# Patient Record
Sex: Male | Born: 1955 | Race: White | Hispanic: No | Marital: Married | Smoking: Former smoker
Health system: Southern US, Community
[De-identification: ages and names within clinical notes are randomized; demographics above are authoritative.]

## PROBLEM LIST (undated history)

## (undated) DIAGNOSIS — H3530 Unspecified macular degeneration: Secondary | ICD-10-CM

## (undated) DIAGNOSIS — E785 Hyperlipidemia, unspecified: Secondary | ICD-10-CM

## (undated) DIAGNOSIS — I10 Essential (primary) hypertension: Secondary | ICD-10-CM

## (undated) DIAGNOSIS — M1990 Unspecified osteoarthritis, unspecified site: Secondary | ICD-10-CM

## (undated) DIAGNOSIS — H9191 Unspecified hearing loss, right ear: Secondary | ICD-10-CM

## (undated) DIAGNOSIS — Z8489 Family history of other specified conditions: Secondary | ICD-10-CM

## (undated) DIAGNOSIS — E119 Type 2 diabetes mellitus without complications: Secondary | ICD-10-CM

## (undated) DIAGNOSIS — G4730 Sleep apnea, unspecified: Secondary | ICD-10-CM

## (undated) DIAGNOSIS — Z87442 Personal history of urinary calculi: Secondary | ICD-10-CM

## (undated) DIAGNOSIS — N200 Calculus of kidney: Secondary | ICD-10-CM

## (undated) DIAGNOSIS — K922 Gastrointestinal hemorrhage, unspecified: Secondary | ICD-10-CM

## (undated) HISTORY — PX: KIDNEY STONE SURGERY: SHX686

## (undated) HISTORY — PX: ACHILLES TENDON REPAIR: SUR1153

## (undated) HISTORY — PX: COLONOSCOPY: SHX174

---

## 2004-08-20 ENCOUNTER — Emergency Department: Payer: Self-pay | Admitting: General Practice

## 2012-04-26 ENCOUNTER — Ambulatory Visit: Payer: Self-pay | Admitting: Emergency Medicine

## 2012-04-26 LAB — RAPID STREP-A WITH REFLX: Micro Text Report: NEGATIVE

## 2012-04-26 LAB — RAPID INFLUENZA A&B ANTIGENS

## 2012-04-28 LAB — BETA STREP CULTURE(ARMC)

## 2012-11-11 DIAGNOSIS — T884XXA Failed or difficult intubation, initial encounter: Secondary | ICD-10-CM | POA: Insufficient documentation

## 2014-01-02 DIAGNOSIS — K922 Gastrointestinal hemorrhage, unspecified: Secondary | ICD-10-CM | POA: Insufficient documentation

## 2014-04-10 DIAGNOSIS — D500 Iron deficiency anemia secondary to blood loss (chronic): Secondary | ICD-10-CM | POA: Insufficient documentation

## 2014-07-17 DIAGNOSIS — E7800 Pure hypercholesterolemia, unspecified: Secondary | ICD-10-CM | POA: Insufficient documentation

## 2014-07-17 DIAGNOSIS — I10 Essential (primary) hypertension: Secondary | ICD-10-CM | POA: Insufficient documentation

## 2014-09-13 ENCOUNTER — Encounter: Payer: Self-pay | Admitting: Emergency Medicine

## 2014-09-13 ENCOUNTER — Ambulatory Visit
Admission: EM | Admit: 2014-09-13 | Discharge: 2014-09-13 | Disposition: A | Discharge status: Home / Self Care | Payer: Managed Care, Other (non HMO) | Attending: Registered Nurse | Admitting: Registered Nurse

## 2014-09-13 DIAGNOSIS — T148 Other injury of unspecified body region: Secondary | ICD-10-CM

## 2014-09-13 DIAGNOSIS — T148XXA Other injury of unspecified body region, initial encounter: Secondary | ICD-10-CM

## 2014-09-13 DIAGNOSIS — L03116 Cellulitis of left lower limb: Secondary | ICD-10-CM

## 2014-09-13 HISTORY — DX: Calculus of kidney: N20.0

## 2014-09-13 HISTORY — DX: Essential (primary) hypertension: I10

## 2014-09-13 MED ORDER — CLINDAMYCIN HCL 300 MG PO CAPS: 300.0000 mg | ORAL_CAPSULE | Freq: Four times a day (QID) | ORAL | Status: AC

## 2014-09-13 MED ORDER — ACETAMINOPHEN 500 MG PO TABS: 1000.0000 mg | ORAL_TABLET | Freq: Four times a day (QID) | ORAL | Status: DC | PRN

## 2014-09-13 NOTE — ED Provider Notes (Signed)
CSN: [id number]     Arrival date & time 09/13/14  1137 History   None    Chief Complaint  Patient presents with  . 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  Patient slightly limping due to pain.  The history is provided by the patient.    Past Medical History  Diagnosis Date  . Kidney stones   . Hypertension    History reviewed. No pertinent past surgical history. History reviewed. No pertinent family history. History  Substance Use Topics  . Smoking status: Never Smoker   . Smokeless tobacco: Never Used  . Alcohol Use: No    Review of Systems  Constitutional: Negative for fever, chills, diaphoresis, activity change, appetite change and fatigue.  HENT: Negative for facial swelling and nosebleeds.   Eyes: Negative for pain and discharge.  Cardiovascular: Positive for leg swelling. Negative for chest pain and palpitations.  Gastrointestinal: Negative for nausea, vomiting and diarrhea.  Endocrine: Negative for cold intolerance and heat intolerance.  Genitourinary: Negative for dysuria.  Musculoskeletal: Positive for myalgias and gait problem. Negative for back pain, joint swelling, arthralgias, neck pain and neck stiffness.  Skin: Positive for color change and rash. Negative for pallor and wound.  Allergic/Immunologic: Negative for food allergies.  Neurological: Negative for dizziness, tremors, syncope, weakness and headaches.  Hematological: Negative for adenopathy. Does not bruise/bleed easily.  Psychiatric/Behavioral: Negative for behavioral problems, confusion, sleep disturbance and agitation.    Allergies  Penicillins and Sulfa antibiotics  Home Medications   Prior to  Admission medications   Medication Sig Start Date End Date Taking? Authorizing Provider  lisinopril (PRINIVIL,ZESTRIL) 5 MG tablet Take 5 mg by mouth daily.   Yes Historical Provider, MD  tamsulosin (FLOMAX) 0.4 MG CAPS capsule Take 0.4 mg by mouth.   Yes Historical Provider, MD  acetaminophen (TYLENOL) 500 MG tablet Take 2 tablets (1,000 mg total) by mouth every 6 (six) hours as needed for moderate pain. 09/13/14   Barbaraann Barthel, NP  clindamycin (CLEOCIN) 300 MG capsule Take 1 capsule (300 mg total) by mouth 4 (four) times daily. 09/13/14 09/19/14  Jarold Song Betancourt, NP   BP 123/75 mmHg  Pulse 78  Temp(Src) 98.5 F (36.9 C) (Oral)  Resp 16  Ht  (1.93 m)  Wt 290 lb (131.543 kg)  BMI 35.31 kg/m2  SpO2 97% Physical Exam  Constitutional: He is oriented to person, place, and time. Vital signs are normal. He appears well-developed and well-nourished. No distress.  HENT:  Head: Normocephalic and atraumatic.  Right Ear: External ear normal.  Left Ear: External ear normal.  Nose: Nose normal.  Mouth/Throat: Oropharynx is clear and moist. No oropharyngeal exudate.  Eyes: Conjunctivae, EOM and lids are normal. Pupils are equal, round, and reactive to light. Right eye exhibits no discharge. Left eye exhibits no discharge. No scleral icterus.  Neck: Trachea normal and normal range of motion. Neck supple. No tracheal deviation present.  Cardiovascular: Normal rate, regular rhythm and intact distal pulses.   Pulmonary/Chest: Effort normal and breath sounds normal. No respiratory distress. He has no wheezes.  Abdominal: Soft. He exhibits no distension.  Musculoskeletal: Normal range of motion. He exhibits edema and tenderness.       Right lower leg: He exhibits tenderness, swelling and deformity. He exhibits no bony  tenderness, no edema and no laceration.  Limping with ambulation in exam room and hallway  Neurological: He is alert and oriented to person, place, and time.  Skin: Skin is warm, dry  and intact. Rash noted. No abrasion, no bruising, no burn, no ecchymosis, no laceration, no lesion, no petechiae and no purpura noted. Rash is macular. Rash is not papular, not maculopapular, not nodular, not pustular, not vesicular and not urticarial. He is not diaphoretic. There is erythema. No pallor.     Psychiatric: He has a normal mood and affect. His speech is normal and behavior is normal. Judgment and thought content normal. Cognition and memory are normal.  Nursing note and vitals reviewed.   ED Course  Procedures (including critical care time) Labs Review Labs Reviewed - No data to display  Imaging Review No results found.   MDM   1. Cellulitis of left lower extremity   2. Contusion    Exitcare handout on skin infection given to patient.  Allergic to penicillin and sulfa will start clindamycin 300mg  po QID F7D.  RTC if worsening erythema, pain, purulent discharge, fever or no improvement after 48 hours on antibiotics.  Discussed with patient sometimes 14 day course clindamycin required and to call day 6 if redness not completely resolved but improving to extend Rx duration.  Patient verbalized understanding, agreed with plan of care and had no further questions at this time.   Discussed with patient to ice, rest, elevate left leg.  Bone contusion may take weeks to completely resolve/heal.  Follow up for re-evaluation if no improvement or worsening for imaging.  Patient didn't want xray at this time.  Patient verbalized understanding of information/instructions, agreed with plan of care and had no further questions at this time.     Barbaraann Barthelina A Betancourt, NP 09/13/14 1328

## 2014-09-13 NOTE — Discharge Instructions (Signed)
Contusion °A contusion is a deep bruise. Contusions are the result of an injury that caused bleeding under the skin. The contusion may turn blue, purple, or yellow. Minor injuries will give you a painless contusion, but more severe contusions may stay painful and swollen for a few weeks.  °CAUSES  °A contusion is usually caused by a blow, trauma, or direct force to an area of the body. °SYMPTOMS  °· Swelling and redness of the injured area. °· Bruising of the injured area. °· Tenderness and soreness of the injured area. °· Pain. °DIAGNOSIS  °The diagnosis can be made by taking a history and physical exam. An X-ray, CT scan, or MRI may be needed to determine if there were any associated injuries, such as fractures. °TREATMENT  °Specific treatment will depend on what area of the body was injured. In general, the best treatment for a contusion is resting, icing, elevating, and applying cold compresses to the injured area. Over-the-counter medicines may also be recommended for pain control. Ask your caregiver what the best treatment is for your contusion. °HOME CARE INSTRUCTIONS  °· Put ice on the injured area. °¨ Put ice in a plastic bag. °¨ Place a towel between your skin and the bag. °¨ Leave the ice on for 15-20 minutes, 3-4 times a day, or as directed by your health care provider. °· Only take over-the-counter or prescription medicines for pain, discomfort, or fever as directed by your caregiver. Your caregiver may recommend avoiding anti-inflammatory medicines (aspirin, ibuprofen, and naproxen) for 48 hours because these medicines may increase bruising. °· Rest the injured area. °· If possible, elevate the injured area to reduce swelling. °SEEK IMMEDIATE MEDICAL CARE IF:  °· You have increased bruising or swelling. °· You have pain that is getting worse. °· Your swelling or pain is not relieved with medicines. °MAKE SURE YOU:  °· Understand these instructions. °· Will watch your condition. °· Will get help right  away if you are not doing well or get worse. °Document Released: 01/08/2005 Document Revised: 04/05/2013 Document Reviewed: 02/03/2011 °ExitCare® Patient Information ©2015 [employer]. This information is not intended to replace advice given to you by your health care provider. Make sure you discuss any questions you have with your health care provider. °Cellulitis °Cellulitis is an infection of the skin and the tissue beneath it. The infected area is usually red and tender. Cellulitis occurs most often in the arms and lower legs.  °CAUSES  °Cellulitis is caused by bacteria that enter the skin through cracks or cuts in the skin. The most common types of bacteria that cause cellulitis are staphylococci and streptococci. °SIGNS AND SYMPTOMS  °· Redness and warmth. °· Swelling. °· Tenderness or pain. °· Fever. °DIAGNOSIS  °Your health care provider can usually determine what is wrong based on a physical exam. Blood tests may also be done. °TREATMENT  °Treatment usually involves taking an antibiotic medicine. °HOME CARE INSTRUCTIONS  °· Take your antibiotic medicine as directed by your health care provider. Finish the antibiotic even if you start to feel better. °· Keep the infected arm or leg elevated to reduce swelling. °· Apply a warm cloth to the affected area up to 4 times per day to relieve pain. °· Take medicines only as directed by your health care provider. °· Keep all follow-up visits as directed by your health care provider. °SEEK MEDICAL CARE IF:  °· You notice red streaks coming from the infected area. °· Your red area gets larger or turns   dark in color. °· Your bone or joint underneath the infected area becomes painful after the skin has healed. °· Your infection returns in the same area or another area. °· You notice a swollen bump in the infected area. °· You develop new symptoms. °· You have a fever. °SEEK IMMEDIATE MEDICAL CARE IF:  °· You feel very sleepy. °· You develop vomiting or diarrhea. °· You  have a general ill feeling (malaise) with muscle aches and pains. °MAKE SURE YOU:  °· Understand these instructions. °· Will watch your condition. °· Will get help right away if you are not doing well or get worse. °Document Released: 01/08/2005 Document Revised: 08/15/2013 Document Reviewed: 06/16/2011 °ExitCare® Patient Information ©2015 [employer]. This information is not intended to replace advice given to you by your health care provider. Make sure you discuss any questions you have with your health care provider. ° °

## 2014-09-13 NOTE — ED Notes (Signed)
Patient states he hit his shin 1 week ago.  Leg is swollen and painful all the way down to ankle

## 2016-02-26 DIAGNOSIS — E1165 Type 2 diabetes mellitus with hyperglycemia: Secondary | ICD-10-CM | POA: Insufficient documentation

## 2016-02-26 DIAGNOSIS — E119 Type 2 diabetes mellitus without complications: Secondary | ICD-10-CM | POA: Insufficient documentation

## 2016-08-28 ENCOUNTER — Encounter: Payer: Self-pay | Admitting: Emergency Medicine

## 2016-08-28 ENCOUNTER — Emergency Department
Admission: EM | Admit: 2016-08-28 | Discharge: 2016-08-28 | Disposition: A | Discharge status: Home / Self Care | Payer: Managed Care, Other (non HMO) | Attending: Emergency Medicine | Admitting: Emergency Medicine

## 2016-08-28 ENCOUNTER — Emergency Department: Payer: Managed Care, Other (non HMO)

## 2016-08-28 DIAGNOSIS — E119 Type 2 diabetes mellitus without complications: Secondary | ICD-10-CM | POA: Diagnosis not present

## 2016-08-28 DIAGNOSIS — Z7984 Long term (current) use of oral hypoglycemic drugs: Secondary | ICD-10-CM | POA: Diagnosis not present

## 2016-08-28 DIAGNOSIS — R42 Dizziness and giddiness: Secondary | ICD-10-CM | POA: Diagnosis present

## 2016-08-28 DIAGNOSIS — I10 Essential (primary) hypertension: Secondary | ICD-10-CM | POA: Diagnosis not present

## 2016-08-28 DIAGNOSIS — Z79899 Other long term (current) drug therapy: Secondary | ICD-10-CM | POA: Insufficient documentation

## 2016-08-28 HISTORY — DX: Unspecified hearing loss, right ear: H91.91

## 2016-08-28 HISTORY — DX: Type 2 diabetes mellitus without complications: E11.9

## 2016-08-28 LAB — BASIC METABOLIC PANEL
Anion gap: 10 (ref 5–15)
BUN: 18 mg/dL (ref 6–20)
CHLORIDE: 108 mmol/L (ref 101–111)
CO2: 19 mmol/L — AB (ref 22–32)
Calcium: 9.3 mg/dL (ref 8.9–10.3)
Creatinine, Ser: 0.98 mg/dL (ref 0.61–1.24)
GFR calc non Af Amer: >60 mL/min (ref >60)
Glucose, Bld: 203 mg/dL — ABNORMAL HIGH (ref 65–99)
POTASSIUM: 4.8 mmol/L (ref 3.5–5.1)
SODIUM: 137 mmol/L (ref 135–145)

## 2016-08-28 LAB — CBC
HEMATOCRIT: 42.4 % (ref 40.0–52.0)
Hemoglobin: 14.5 g/dL (ref 13.0–18.0)
MCH: 31.3 pg (ref 26.0–34.0)
MCHC: 34.3 g/dL (ref 32.0–36.0)
MCV: 91.2 fL (ref 80.0–100.0)
Platelets: 229 10*3/uL (ref 150–440)
RBC: 4.65 MIL/uL (ref 4.40–5.90)
RDW: 12.6 % (ref 11.5–14.5)
WBC: 7.2 10*3/uL (ref 3.8–10.6)

## 2016-08-28 LAB — TROPONIN I: Troponin I: <0.03 ng/mL (ref <0.03)

## 2016-08-28 MED ORDER — ONDANSETRON HCL 4 MG/2ML IJ SOLN
4.0000 mg | Freq: Once | INTRAMUSCULAR | Status: AC
  Administered 2016-08-28: 4 mg via INTRAVENOUS
  Filled 2016-08-28: qty 2

## 2016-08-28 MED ORDER — DIAZEPAM 5 MG PO TABS: 5.0000 mg | ORAL_TABLET | Freq: Three times a day (TID) | ORAL | 0 refills | Status: DC | PRN

## 2016-08-28 MED ORDER — MECLIZINE HCL 25 MG PO TABS: 25.0000 mg | ORAL_TABLET | Freq: Three times a day (TID) | ORAL | 1 refills | Status: DC | PRN

## 2016-08-28 MED ORDER — SODIUM CHLORIDE 0.9 % IV SOLN
Freq: Once | INTRAVENOUS | Status: AC
  Administered 2016-08-28: 11:00:00 via INTRAVENOUS

## 2016-08-28 MED ORDER — DIAZEPAM 5 MG PO TABS
5.0000 mg | ORAL_TABLET | Freq: Once | ORAL | Status: AC
  Administered 2016-08-28: 5 mg via ORAL
  Filled 2016-08-28: qty 1

## 2016-08-28 MED ORDER — MECLIZINE HCL 25 MG PO TABS
50.0000 mg | ORAL_TABLET | Freq: Once | ORAL | Status: AC
  Administered 2016-08-28: 50 mg via ORAL
  Filled 2016-08-28: qty 2

## 2016-08-28 NOTE — ED Provider Notes (Signed)
[facility] Emergency Department Provider Note       Time seen: ----------------------------------------- 11:21 AM on 08/28/2016 -----------------------------------------     I have reviewed the triage vital signs and the nursing notes.   HISTORY   Chief Complaint Dizziness    HPI Mitchell Case is a 61 y.o. male who presents to the ED for sudden onset dizziness began this morning at 5 AM. Patient reports vomiting twice due to dizziness. Patient states dizziness is worse with movement and with his eyes open. He denies any history of vertigo. He did have sudden onset left hearing loss in January. ENT was not sure why he lost hearing. He took 3 weeks of steroids which somewhat improved his hearing loss. He was started on glipizide 2 days ago for his diabetes. He denies weakness or confusion.    Past Medical History:  Diagnosis Date  . Diabetes mellitus without complication (HCC)   . 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 Hypertension   . Kidney stones     There are no active problems to display for this patient.   Past Surgical History:  Procedure Laterality Date  . KIDNEY STONE SURGERY Bilateral     Allergies Penicillins and Sulfa antibiotics  Social History Social History  Substance Use Topics  . Smoking status: Never Smoker  . Smokeless tobacco: Never Used  . Alcohol use No    Review of Systems Constitutional: Negative for fever. Eyes: Negative for vision changes ENT:  Negative for congestion, sore throat Cardiovascular: Negative for chest pain. Respiratory: Negative for shortness of breath. Gastrointestinal: Negative for abdominal pain, vomiting and diarrhea. Genitourinary: Negative for dysuria. Musculoskeletal: Negative for back pain. Skin: Negative for rash. Neurological: Negative for headaches, focal weakness or numbness.  All systems negative/normal/unremarkable except as stated in the  HPI  ____________________________________________   PHYSICAL EXAM:  VITAL SIGNS: ED Triage Vitals  Enc Vitals Group     BP 08/28/16 0957 119/77     Pulse Rate 08/28/16 0957 62     Resp 08/28/16 0957 18     Temp 08/28/16 0957 97.7 F (36.5 C)     Temp Source 08/28/16 0950 Oral     SpO2 08/28/16 0957 98 %     Weight 08/28/16 0950 285 lb (129.3 kg)     Height 08/28/16 0950 6\' 4"  (1.93 m)     Head Circumference --      Peak Flow --      Pain Score 08/28/16 0949 0     Pain Loc --      Pain Edu? --      Excl. in GC? --     Constitutional: Alert and oriented. Well appearing and in no distress. Eyes: Conjunctivae are normal. PERRL. Normal extraocular movements. ENT   Head: Normocephalic and atraumatic.   Nose: No congestion/rhinnorhea.   Mouth/Throat: Mucous membranes are moist.   Neck: No stridor. Cardiovascular: Normal rate, regular rhythm. No murmurs, rubs, or gallops. Respiratory: Normal respiratory effort without tachypnea nor retractions. Breath sounds are clear and equal bilaterally. No wheezes/rales/rhonchi. Gastrointestinal: Soft and nontender. Normal bowel sounds Musculoskeletal: Nontender with normal range of motion in extremities. No lower extremity tenderness nor edema. Neurologic:  Normal speech and language. No gross focal neurologic deficits are appreciated. Strength, sensation, cranial nerves are intact Skin:  Skin is warm, dry and intact. No rash noted. Psychiatric: Mood and affect are normal. Speech and behavior are normal.  ____________________________________________  EKG: Interpreted by me.  Sinus rhythm with a rate of 65 bpm, normal PR interval, normal QRS, normal QT.  ____________________________________________  ED COURSE:  Pertinent labs & imaging results that were available during my care of the patient were reviewed by me and considered in my medical decision making (see chart for details). Patient presents for vertigo symptoms, we will  assess with labs and imaging as indicated.   Procedures ____________________________________________   LABS (pertinent positives/negatives)  Labs Reviewed  BASIC METABOLIC PANEL - Abnormal; Notable for the following:       Result Value   CO2 19 (*)    Glucose, Bld 203 (*)    All other components within normal limits  CBC  URINALYSIS, COMPLETE (UACMP) WITH MICROSCOPIC  TROPONIN I    RADIOLOGY Images were viewed by me  CT head is unremarkable  ____________________________________________  FINAL ASSESSMENT AND PLAN  Vertigo  Plan: Patient's labs and imaging were dictated above. Patient had presented for symptoms of vertigo that began around 5 AM. Currently symptoms are improved with meclizine and Valium. Clinically this does not appear to be central vertigo and his symptoms have resolved at this time. He'll be referred to ENT for outpatient follow-up. He does take a baby aspirin, we will encourage continue taking his home medications but holding his glipizide until vertigo has resolved.    Emily FilbertWilliams, Klohe Lovering E, MD   Note: This note was generated in part or whole with voice recognition software. Voice recognition is usually quite accurate but there are transcription errors that can and very often do occur. I apologize for any typographical errors that were not detected and corrected.     Emily FilbertWilliams, Irma Delancey E, MD 08/28/16 [phone]

## 2016-08-28 NOTE — ED Notes (Signed)
NAD noted at time of D/C. Pt denies questions or concerns. Pt ambulatory to the lobby at this time. Pt refused wheelchair to the lobby.  

## 2016-08-28 NOTE — ED Triage Notes (Signed)
Patient presents to the ED with sudden dizziness that began this am at 5.  Patient reports vomiting x 2 due to dizziness this am.  Patient states dizziness is worse with movement and with eyes open.  Patient denies history of vertigo.  Patient states he had sudden right ear hearing loss in January.  Patient states his ENT wasn't sure of why he lost his hearing.  Patient reports having been started on Gilpizide 2 days ago for his diabetes.  Patient denies weakness or confusion.  No obvious distress at this time.

## 2020-06-24 DIAGNOSIS — M1711 Unilateral primary osteoarthritis, right knee: Secondary | ICD-10-CM | POA: Insufficient documentation

## 2020-06-24 DIAGNOSIS — M1712 Unilateral primary osteoarthritis, left knee: Secondary | ICD-10-CM | POA: Insufficient documentation

## 2020-07-08 NOTE — Discharge Instructions (Signed)
Instructions after Total Knee Replacement   Younis Mathey P. Tou Hayner, Jr., M.D.     Dept. of Orthopaedics & Sports Medicine  [facility]  [address]  [place], [place]  [address]  Phone: [phone]   Fax: [phone]    DIET: Drink plenty of non-alcoholic fluids. Resume your normal diet. Include foods high in fiber.  ACTIVITY:  You may use crutches or a walker with weight-bearing as tolerated, unless instructed otherwise. You may be weaned off of the walker or crutches by your Physical Therapist.  Do NOT place pillows under the knee. Anything placed under the knee could limit your ability to straighten the knee.   Continue doing gentle exercises. Exercising will reduce the pain and swelling, increase motion, and prevent muscle weakness.   Please continue to use the TED compression stockings for 6 weeks. You may remove the stockings at night, but should reapply them in the morning. Do not drive or operate any equipment until instructed.  WOUND CARE:  Continue to use the PolarCare or ice packs periodically to reduce pain and swelling. You may bathe or shower after the staples are removed at the first office visit following surgery.  MEDICATIONS: You may resume your regular medications. Please take the pain medication as prescribed on the medication. Do not take pain medication on an empty stomach. You have been given a prescription for a blood thinner (Lovenox or Coumadin). Please take the medication as instructed. (NOTE: After completing a 2 week course of Lovenox, take one Enteric-coated aspirin once a day. This along with elevation will help reduce the possibility of phlebitis in your operated leg.) Do not drive or drink alcoholic beverages when taking pain medications.  CALL THE OFFICE FOR: Temperature above 101 degrees Excessive bleeding or drainage on the dressing. Excessive swelling, coldness, or paleness of the toes. Persistent nausea and vomiting.  FOLLOW-UP:  You  should have an appointment to return to the office in 10-14 days after surgery. Arrangements have been made for continuation of Physical Therapy (either home therapy or outpatient therapy).   [facility] Department Directory         [email or web address]       [email or web address]          Cardiology  Appointments: [place] - [phone] [place] - [phone]  Endocrinology  Appointments: [place] - [phone] [place] - [phone]  Gastroenterology  Appointments: [place] - [phone] [place] - [phone]        General Surgery   Appointments: [place] - [phone]  Internal Medicine/Family Medicine  Appointments: [place] - [phone] [place] - [phone] [place] - [phone]  Metabolic and Weigh Loss Surgery  Appointments: [place] - [phone]        Neurology  Appointments: [place] - [phone] [place] - [phone]  Neurosurgery  Appointments: [place] - [phone]  Obstetrics & Gynecology  Appointments: [place] - [phone] [place] - [phone]        Pediatrics  Appointments: [place] - [phone] [place] - [phone]  Physiatry  Appointments: [place] -[phone]  Physical Therapy  Appointments: [place] - [phone] [place] - [phone]        Podiatry  Appointments: [place] - [phone] [place] - [phone]  Pulmonology  Appointments: [place] - [phone]  Rheumatology  Appointments: [place] - [phone]        [place] Location: [facility]  [address] [place], [place]  [address]  [place] Location: [facility] [address] [place], [place]  [address]  [place] Location: [facility] [address] [place], [place]  [address]    

## 2020-07-12 ENCOUNTER — Other Ambulatory Visit (HOSPITAL_COMMUNITY): Payer: Self-pay | Admitting: Family Medicine

## 2020-07-12 ENCOUNTER — Other Ambulatory Visit: Payer: Self-pay | Admitting: Family Medicine

## 2020-07-12 DIAGNOSIS — R748 Abnormal levels of other serum enzymes: Secondary | ICD-10-CM

## 2020-07-13 ENCOUNTER — Other Ambulatory Visit: Payer: Self-pay

## 2020-07-13 ENCOUNTER — Other Ambulatory Visit
Admission: RE | Admit: 2020-07-13 | Discharge: 2020-07-13 | Disposition: A | Discharge status: Home / Self Care | Payer: Medicare HMO | Source: Ambulatory Visit | Attending: Orthopedic Surgery | Admitting: Orthopedic Surgery

## 2020-07-13 DIAGNOSIS — Z01818 Encounter for other preprocedural examination: Secondary | ICD-10-CM | POA: Diagnosis present

## 2020-07-13 HISTORY — DX: Personal history of urinary calculi: Z87.442

## 2020-07-13 HISTORY — DX: Unspecified macular degeneration: H35.30

## 2020-07-13 HISTORY — DX: Family history of other specified conditions: Z84.89

## 2020-07-13 HISTORY — DX: Unspecified osteoarthritis, unspecified site: M19.90

## 2020-07-13 HISTORY — DX: Gastrointestinal hemorrhage, unspecified: K92.2

## 2020-07-13 HISTORY — DX: Sleep apnea, unspecified: G47.30

## 2020-07-13 LAB — COMPREHENSIVE METABOLIC PANEL
ALT: 108 U/L — ABNORMAL HIGH (ref 0–44)
AST: 73 U/L — ABNORMAL HIGH (ref 15–41)
Albumin: 4.3 g/dL (ref 3.5–5.0)
Alkaline Phosphatase: 36 U/L — ABNORMAL LOW (ref 38–126)
Anion gap: 8 (ref 5–15)
BUN: 16 mg/dL (ref 8–23)
CO2: 24 mmol/L (ref 22–32)
Calcium: 9.6 mg/dL (ref 8.9–10.3)
Chloride: 107 mmol/L (ref 98–111)
Creatinine, Ser: 1.12 mg/dL (ref 0.61–1.24)
GFR, Estimated: >60 mL/min (ref >60)
Glucose, Bld: 147 mg/dL — ABNORMAL HIGH (ref 70–99)
Potassium: 4 mmol/L (ref 3.5–5.1)
Sodium: 139 mmol/L (ref 135–145)
Total Bilirubin: 0.9 mg/dL (ref 0.3–1.2)
Total Protein: 7.7 g/dL (ref 6.5–8.1)

## 2020-07-13 LAB — SEDIMENTATION RATE: Sed Rate: 13 mm/hr (ref 0–20)

## 2020-07-13 LAB — APTT: aPTT: 33 seconds (ref 24–36)

## 2020-07-13 LAB — CBC
HCT: 42.1 % (ref 39.0–52.0)
Hemoglobin: 14.3 g/dL (ref 13.0–17.0)
MCH: 31.5 pg (ref 26.0–34.0)
MCHC: 34 g/dL (ref 30.0–36.0)
MCV: 92.7 fL (ref 80.0–100.0)
Platelets: 263 10*3/uL (ref 150–400)
RBC: 4.54 MIL/uL (ref 4.22–5.81)
RDW: 12.9 % (ref 11.5–15.5)
WBC: 6.1 10*3/uL (ref 4.0–10.5)
nRBC: 0 % (ref 0.0–0.2)

## 2020-07-13 LAB — SURGICAL PCR SCREEN
MRSA, PCR: NEGATIVE
Staphylococcus aureus: NEGATIVE

## 2020-07-13 LAB — URINALYSIS, ROUTINE W REFLEX MICROSCOPIC
Bilirubin Urine: NEGATIVE
Glucose, UA: NEGATIVE mg/dL
Hgb urine dipstick: NEGATIVE
Ketones, ur: NEGATIVE mg/dL
Leukocytes,Ua: NEGATIVE
Nitrite: NEGATIVE
Protein, ur: NEGATIVE mg/dL
Specific Gravity, Urine: 1.015 (ref 1.005–1.030)
pH: 6 (ref 5.0–8.0)

## 2020-07-13 LAB — C-REACTIVE PROTEIN: CRP: 1.2 mg/dL — ABNORMAL HIGH (ref <1.0)

## 2020-07-13 LAB — HEMOGLOBIN A1C
Hgb A1c MFr Bld: 6.9 % — ABNORMAL HIGH (ref 4.8–5.6)
Mean Plasma Glucose: 151.33 mg/dL

## 2020-07-13 LAB — PROTIME-INR
INR: 1.2 (ref 0.8–1.2)
Prothrombin Time: 14.9 seconds (ref 11.4–15.2)

## 2020-07-13 NOTE — Progress Notes (Addendum)
  Perioperative Services Pre-Admission/Anesthesia Testing  Date: 07/13/20  Name: Mitchell Case MRN:   [id number]  Re: ECG changes and need for preoperative cardiac evaluation and clearance   Case: [id number] Date/Time: 07/23/20 1512   Procedure: COMPUTER ASSISTED TOTAL KNEE ARTHROPLASTY (Left Knee)   Anesthesia type: Choice   Pre-op diagnosis: PRIMARY OSTEOARTHRITIS OF LEFT KNEE.   Location: [facility] OR ROOM 01 / [facility]S FOR ANESTHESIA [facility]   Surgeons: Donato Heinz, MD    Patient scheduled for the above procedure on 07/23/2020 with Dr. Francesco Sor.  As part of patient's preoperative work-up, patient presented to the PAT clinic on 07/13/2020 for routine ECG.  ECG performed today showed normal sinus rhythm rate of 82 bpm with evidence of an age undetermined inferior infarct.  When compared to last ECG performed on 08/28/2016, there are presumably new inferior T wave inversions noted (II, III and aVF). There are Q waves also present in these leads, however these were present on the tracing back from 2018.  I have reviewed the care everywhere system to determine if patient has had any other ECG tracings since the one that I have access to from 08/2016.  Patient did have a preoperative ECG performed on 07/08/2017 within the [facility] system.  I have contacted Dr. Elenor Legato surgery scheduler to request that a copy of this ECG be forwarded to me for review and comparison.    PLANS:  1. Obtain most recent ECG (07/08/2017) from surgeon's office.   Plan to compare today's tracing to the most recent ECG to determine whether or not the inferior TWIs seen today were there in 2019. If not, patient will be referred to cardiology for further evaluation of possible ischemic changes.   2. Copy of this note forwarded to patient's primary attending surgeon Ernest Pine, MD) to update him on noted changes and plans for further evaluation prior to surgery.  3. No changes are being made to the surgical schedule at this time.   Patient being left on the OR schedule for 07/23/2020.  In the event that patient unable to get in with cardiology, or cardiology recommends further ischemic evaluation prior to surgery, patient's planned procedural course will need to be postponed.  ADDENDUM - 07/13/2020 @ 1615 PM -  ECG received from [facility]. Reviewed and compared to tracing obtained today and noted TWIs are new when compared to previous. Call placed to patient to discuss. Patient advising that he has been completely asymptomatic from a CV perspective; denies angina/anginal equivalent symptoms. Patient will need to be seen by cardiology prior to his knee surgery. Asked patient which practice he would like to be seen in [facility] or [facility] [employer]). Per patient, it does not matter to him. Wishes for appointment to be made with first available provider in efforts to avoid potentially having to delay his surgery. Call placed to [facility] as [facility] cardiology has closed for the day. [facility] graciously able to see patient on 07/16/2020 at 0800. Patient will be seeing Dr. Debbe Odea, MD for further evaluation and clearance; practice to contact patient to make him aware of appointment details. Copy of updated note forwarded to Dr. Ernest Pine to make him aware of the aforementioned.   Mitchell Mulling, Mitchell Case, Mitchell Case, Mitchell Case, Mitchell Case [facility]  Peri-operative Services Nurse Practitioner Phone: [phone] 07/13/20 3:17 PM

## 2020-07-13 NOTE — Patient Instructions (Addendum)
Your procedure is scheduled on: 4/11 Report to DAY SURGERY DEPARTMENT LOCATED ON 2ND FLOOR MEDICAL MALL ENTRANCE. To find out your arrival time please call [phone] between 1PM - 3PM on 4/8 .  Remember: Instructions that are not followed completely may result in serious medical risk, up to and including death, or upon the discretion of your surgeon and anesthesiologist your surgery may need to be rescheduled.     _X__ 1. Do not eat food after midnight the night before your procedure.                 No gum chewing or hard candies. You may drink clear liquids up to 2 hours                 before you are scheduled to arrive for your surgery-                 Diabetics water only  __X__2.  On the morning of surgery brush your teeth with toothpaste and water, you                 may rinse your mouth with mouthwash if you wish.  Do not swallow any              toothpaste of mouthwash.     _X__ 3.  No Alcohol for 24 hours before or after surgery.   _X__ 4.  Do Not Smoke or use e-cigarettes For 24 Hours Prior to Your Surgery.                 Do not use any chewable tobacco products for at least 6 hours prior to                 surgery.  ____  5.  Bring all medications with you on the day of surgery if instructed.   __X__  6.  Notify your doctor if there is any change in your medical condition      (cold, fever, infections).     Do not wear jewelry, make-up, hairpins, clips or nail polish. Do not wear lotions, powders, or perfumes.  Do not shave 48 hours prior to surgery. Men may shave face and neck. Do not bring valuables to the hospital.    [facility] is not responsible for any belongings or valuables.  Contacts, dentures/partials or body piercings may not be worn into surgery. Bring a case for your contacts, glasses or hearing aids, a denture cup will be supplied. Leave your suitcase in the car. After surgery it may be brought to your room. For patients admitted to the hospital,  discharge time is determined by your treatment team.   Patients discharged the day of surgery will not be allowed to drive home.   Please read over the following fact sheets that you were given:   MRSA Information  __X__ Take these medicines the morning of surgery with A SIP OF WATER:    1.   2.   3.   4.  5.  6.  ____ Fleet Enema (as directed)   __X__ Use CHG Soap/SAGE wipes as directed  ____ Use inhalers on the day of surgery  __X__ Stop metformin/Janumet/Farxiga 2 days prior to surgery    ____ Take 1/2 of usual insulin dose the night before surgery. No insulin the morning          of surgery.   ____ Stop Blood Thinners Coumadin/Plavix/Xarelto/Pleta/Pradaxa/Eliquis/Effient/Aspirin  on  Or contact your  Surgeon, Cardiologist or Medical Doctor regarding  ability to stop your blood thinners  __X__ Stop Anti-inflammatories 7 days before surgery such as Advil, Ibuprofen, Motrin,  BC or Goodies Powder, Naprosyn, Naproxen, Aleve, Aspirin    __X__ Stop all herbal supplements, fish oil or vitamin E until after surgery.    ____ Bring C-Pap to the hospital.

## 2020-07-15 LAB — URINE CULTURE
Culture: NO GROWTH
Special Requests: NORMAL

## 2020-07-16 ENCOUNTER — Other Ambulatory Visit: Payer: Self-pay

## 2020-07-16 ENCOUNTER — Ambulatory Visit (INDEPENDENT_AMBULATORY_CARE_PROVIDER_SITE_OTHER): Payer: Medicare HMO

## 2020-07-16 ENCOUNTER — Encounter: Payer: Self-pay | Admitting: Cardiology

## 2020-07-16 ENCOUNTER — Encounter: Payer: Self-pay | Admitting: Orthopedic Surgery

## 2020-07-16 ENCOUNTER — Ambulatory Visit (INDEPENDENT_AMBULATORY_CARE_PROVIDER_SITE_OTHER): Payer: Medicare HMO | Admitting: Cardiology

## 2020-07-16 VITALS — BP 122/80 | HR 72 | Ht 76.0 in | Wt 269.0 lb

## 2020-07-16 DIAGNOSIS — Z01810 Encounter for preprocedural cardiovascular examination: Secondary | ICD-10-CM | POA: Diagnosis not present

## 2020-07-16 DIAGNOSIS — E7800 Pure hypercholesterolemia, unspecified: Secondary | ICD-10-CM | POA: Diagnosis not present

## 2020-07-16 DIAGNOSIS — I10 Essential (primary) hypertension: Secondary | ICD-10-CM

## 2020-07-16 DIAGNOSIS — R9431 Abnormal electrocardiogram [ECG] [EKG]: Secondary | ICD-10-CM

## 2020-07-16 DIAGNOSIS — Z01818 Encounter for other preprocedural examination: Secondary | ICD-10-CM

## 2020-07-16 MED ORDER — PERFLUTREN LIPID MICROSPHERE
1.0000 mL | INTRAVENOUS | Status: AC | PRN
  Administered 2020-07-16: 2 mL via INTRAVENOUS

## 2020-07-16 NOTE — Progress Notes (Deleted)
ERROR - see new entry.

## 2020-07-16 NOTE — Progress Notes (Signed)
Cardiology Office Note:    Date:  07/16/2020   ID:  Mitchell Case, DOB [birth year], MRN [id number]  PCP:  Marina Goodell, MD   [facility]  Cardiologist:  Debbe Odea, MD  Advanced Practice Provider:  No care team member to display Electrophysiologist:  None       Referring MD: Marina Goodell, MD   Chief Complaint  Patient presents with  . New Patient (Initial Visit)    Pre op clearance. EKG changes. Meds reviewed verbally with patient.     History of Present Illness:    Mitchell Case is a 65 y.o. male with a hx of hypertension, diabetes, hyperlipidemia who presents for preop evaluation prior to surgery.  Patient has a history of osteoarthritis of the left knee.  Total knee arthroplasty is being planned.  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  He denies any history of heart disease, denies chest pain, had one episode of shortness of breath while at a grocery store 2 months ago, associated with diaphoresis.  Family history of CAD with mother having bypass in her 47s, that has stents in his 31s.   Past Medical History:  Diagnosis Date  . Arthritis   . Diabetes mellitus without complication (HCC)   . Family history of adverse reaction to anesthesia    PONV in mother  . GI bleed   . 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 History of kidney stones   . Hypertension   . Macular degeneration   . Sleep apnea     Past Surgical History:  Procedure Laterality Date  . ACHILLES TENDON REPAIR    . COLONOSCOPY    . KIDNEY STONE SURGERY Bilateral     Current Medications: Current Meds  Medication Sig  . fluticasone (FLONASE) 50 MCG/ACT nasal spray Place 1 spray into both nostrils daily as needed for allergies or rhinitis.  Marland Kitchen ibuprofen (ADVIL) 200 MG tablet Take 800 mg by mouth at bedtime as needed for moderate pain.  Marland Kitchen liraglutide (VICTOZA) 18 MG/3ML SOPN Inject 0.6 mg into the skin  daily.  Marland Kitchen lisinopril (PRINIVIL,ZESTRIL) 10 MG tablet Take 10 mg by mouth at bedtime.  . metFORMIN (GLUCOPHAGE) 1000 MG tablet Take 1,000 mg by mouth 2 (two) times daily with a meal.  . Multiple Vitamins-Minerals (OCUVITE PO) Take 1 tablet by mouth daily.  . rosuvastatin (CRESTOR) 5 MG tablet Take 5 mg by mouth every Monday. At bedtime  . tamsulosin (FLOMAX) 0.4 MG CAPS capsule Take 0.4 mg by mouth at bedtime.     Allergies:   Penicillins, Sulfa antibiotics, Atorvastatin, and Hydrochlorothiazide   Social History   Socioeconomic History  . Marital status: Married    Spouse name: Not on file  . Number of children: Not on file  . Years of education: Not on file  . Highest education level: Not on file  Occupational History  . Not on file  Tobacco Use  . Smoking status: Never Smoker  . Smokeless tobacco: Never Used  Vaping Use  . Vaping Use: Never used  Substance and Sexual Activity  . Alcohol use: No  . Drug use: No  . Sexual activity: Not on file  Other Topics Concern  . Not on file  Social History Narrative  . Not on file   Social Determinants of Health   Financial Resource Strain: Not on file  Food Insecurity: Not on file  Transportation  Needs: Not on file  Physical Activity: Not on file  Stress: Not on file  Social Connections: Not on file     Family History: The patient's family history is not on file.  ROS:   Please see the history of present illness.     All other systems reviewed and are negative.  EKGs/Labs/Other Studies Reviewed:    The following studies were reviewed today:   EKG:  EKG is  ordered today.  The ekg ordered today demonstrates normal sinus rhythm, possible old inferior infarct  Recent Labs: 07/13/2020: ALT 108; BUN 16; Creatinine, Ser 1.12; Hemoglobin 14.3; Platelets 263; Potassium 4.0; Sodium 139  Recent Lipid Panel No results found for: CHOL, TRIG, HDL, CHOLHDL, VLDL, LDLCALC, LDLDIRECT   Risk Assessment/Calculations:       Physical Exam:    VS:  BP 122/80 (BP Location: Left Arm, Patient Position: Sitting, Cuff Size: Large)   Pulse 72   Ht 6\' 4"  (1.93 m)   Wt 269 lb (122 kg)   SpO2 96%   BMI 32.74 kg/m     Wt Readings from Last 3 Encounters:  07/16/20 269 lb (122 kg)  07/13/20 270 lb (122.5 kg)  08/28/16 285 lb (129.3 kg)     GEN:  Well nourished, well developed in no acute distress HEENT: Normal NECK: No JVD; No carotid bruits LYMPHATICS: No lymphadenopathy CARDIAC: RRR, no murmurs, rubs, gallops RESPIRATORY:  Clear to auscultation without rales, wheezing or rhonchi  ABDOMEN: Soft, non-tender, non-distended MUSCULOSKELETAL:  No edema; No deformity  SKIN: Warm and dry NEUROLOGIC:  Alert and oriented x 3 PSYCHIATRIC:  Normal affect   ASSESSMENT:    1. Pre-op evaluation   2. Nonspecific abnormal electrocardiogram (ECG) (EKG)   3. Primary hypertension   4. Pure hypercholesterolemia    PLAN:    In order of problems listed above:  1. Preop evaluation for left knee arthroplasty.  Denies chest pain or shortness of breath.  No history of heart disease.  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  Patient with one episode of diaphoresis and shortness of breath while grocery shopping.  Significant risk factors of hypertension, hyperlipidemia, diabetes, family history of CAD.  Obtain echocardiogram and Lexiscan Myoview hopefully in the next 3 to 5 days.  If testing is normal, okay to proceed with surgical procedure. 2. 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  Denies chest pain or shortness of breath.  Risk factors hypertension hyperlipidemia, diabetes, family history of CAD.  Echocardiogram and Lexiscan Myoview as above.. 3. Hypertension, BP controlled, continue lisinopril. 4. Hyperlipidemia, continue statin.   Follow-up in 1 month   Shared Decision Making/Informed Consent The risks [chest pain, shortness of breath, cardiac arrhythmias, dizziness,  blood pressure fluctuations, myocardial infarction, stroke/transient ischemic attack, nausea, vomiting, allergic reaction, radiation exposure, metallic taste sensation and life-threatening complications (estimated to be 1 in 10,000)], benefits (risk stratification, diagnosing coronary artery disease, treatment guidance) and alternatives of a nuclear stress test were discussed in detail with Mitchell Case and he agrees to proceed.     Medication Adjustments/Labs and Tests Ordered: Current medicines are reviewed at length with the patient today.  Concerns regarding medicines are outlined above.  Orders Placed This Encounter  Procedures  . NM Myocar Multi W/Spect W/Wall Motion / EF  . EKG 12-Lead  . ECHOCARDIOGRAM COMPLETE   No orders of the defined types were placed in this encounter.   Patient Instructions  Medication Instructions:  Your physician recommends that  you continue on your current medications as directed. Please refer to the Current Medication list given to you today.  *If you need a refill on your cardiac medications before your next appointment, please call your pharmacy*   Lab Work: None ordered    Testing/Procedures:  1.  Your physician has requested that you have an echocardiogram ASAP for pre-op eval.. Echocardiography is a painless test that uses sound waves to create images of your heart. It provides your doctor with information about the size and shape of your heart and how well your heart's chambers and valves are working. This procedure takes approximately one hour. There are no restrictions for this procedure.  2.  [facility] MYOVIEW   Your caregiver has ordered a Stress Test with nuclear imaging ASAP for pre-op eval. The purpose of this test is to evaluate the blood supply to your heart muscle. This procedure is referred to as a "Non-Invasive Stress Test." This is because other than having an IV started in your vein, nothing is inserted or "invades" your body. Cardiac  stress tests are done to find areas of poor blood flow to the heart by determining the extent of coronary artery disease (CAD). Some patients exercise on a treadmill, which naturally increases the blood flow to your heart, while others who are  unable to walk on a treadmill due to physical limitations have a pharmacologic/chemical stress agent called Lexiscan . This medicine will mimic walking on a treadmill by temporarily increasing your coronary blood flow.      PLEASE REPORT TO [facility] MEDICAL MALL ENTRANCE   THE VOLUNTEERS AT THE FIRST DESK WILL DIRECT YOU WHERE TO GO     *Please note: these test may take anywhere between 2-4 hours to complete       Date of Procedure:_____________________________________   Arrival Time for Procedure:______________________________    PLEASE NOTIFY THE OFFICE AT LEAST 24 HOURS IN ADVANCE IF YOU ARE UNABLE TO KEEP YOUR APPOINTMENT.  [phone]  PLEASE NOTIFY NUCLEAR MEDICINE AT [facility] AT LEAST 24 HOURS IN ADVANCE IF YOU ARE UNABLE TO KEEP YOUR APPOINTMENT. [phone]      How to prepare for your Myoview test:         _XX___:  Hold diabetes medication the morning of procedure: metFORMIN (GLUCOPHAGE)   1. Do not eat or drink after midnight  2. No caffeine for 24 hours prior to test  3. No smoking 24 hours prior to test.  4. Unless instructed otherwise, Take your medication with a small sips of water.    5.         Ladies, please do not wear dresses. Skirts or pants are appropriate. Please wear a short sleeve shirt.  6. No perfume, cologne or lotion.  7. Wear comfortable walking shoes. No heels!    Follow-Up: At [facility], you and your health needs are our priority.  As part of our continuing mission to provide you with exceptional heart care, we have created designated Provider Care Teams.  These Care Teams include your primary Cardiologist (physician) and Advanced Practice Providers (APPs -  Physician Assistants and Nurse Practitioners) who  all work together to provide you with the care you need, when you need it.  We recommend signing up for the patient portal called "MyChart".  Sign up information is provided on this After Visit Summary.  MyChart is used to connect with patients for Virtual Visits (Telemedicine).  Patients are able to view lab/test results, encounter notes, upcoming appointments, etc.  Non-urgent messages can be sent to your provider as well.   To learn more about what you can do with MyChart, go to [email or web address].    Your next appointment:   1 month(s)  The format for your next appointment:   In Person  Provider:   Debbe Odea, MD   Other Instructions      Signed, Debbe Odea, MD  07/16/2020 12:11 PM    [facility]

## 2020-07-16 NOTE — Patient Instructions (Signed)
Medication Instructions:  Your physician recommends that you continue on your current medications as directed. Please refer to the Current Medication list given to you today.  *If you need a refill on your cardiac medications before your next appointment, please call your pharmacy*   Lab Work: None ordered    Testing/Procedures:  1.  Your physician has requested that you have an echocardiogram ASAP for pre-op eval.. Echocardiography is a painless test that uses sound waves to create images of your heart. It provides your doctor with information about the size and shape of your heart and how well your heart's chambers and valves are working. This procedure takes approximately one hour. There are no restrictions for this procedure.  2.  [facility] MYOVIEW   Your caregiver has ordered a Stress Test with nuclear imaging ASAP for pre-op eval. The purpose of this test is to evaluate the blood supply to your heart muscle. This procedure is referred to as a "Non-Invasive Stress Test." This is because other than having an IV started in your vein, nothing is inserted or "invades" your body. Cardiac stress tests are done to find areas of poor blood flow to the heart by determining the extent of coronary artery disease (CAD). Some patients exercise on a treadmill, which naturally increases the blood flow to your heart, while others who are  unable to walk on a treadmill due to physical limitations have a pharmacologic/chemical stress agent called Lexiscan . This medicine will mimic walking on a treadmill by temporarily increasing your coronary blood flow.      PLEASE REPORT TO [facility] MEDICAL MALL ENTRANCE   THE VOLUNTEERS AT THE FIRST DESK WILL DIRECT YOU WHERE TO GO     *Please note: these test may take anywhere between 2-4 hours to complete       Date of Procedure:_____________________________________   Arrival Time for Procedure:______________________________    PLEASE NOTIFY THE OFFICE AT LEAST 24  HOURS IN ADVANCE IF YOU ARE UNABLE TO KEEP YOUR APPOINTMENT.  [phone]  PLEASE NOTIFY NUCLEAR MEDICINE AT [facility] AT LEAST 24 HOURS IN ADVANCE IF YOU ARE UNABLE TO KEEP YOUR APPOINTMENT. [phone]      How to prepare for your Myoview test:         _XX___:  Hold diabetes medication the morning of procedure: metFORMIN (GLUCOPHAGE)   1. Do not eat or drink after midnight  2. No caffeine for 24 hours prior to test  3. No smoking 24 hours prior to test.  4. Unless instructed otherwise, Take your medication with a small sips of water.    5.         Ladies, please do not wear dresses. Skirts or pants are appropriate. Please wear a short sleeve shirt.  6. No perfume, cologne or lotion.  7. Wear comfortable walking shoes. No heels!    Follow-Up: At [facility], you and your health needs are our priority.  As part of our continuing mission to provide you with exceptional heart care, we have created designated Provider Care Teams.  These Care Teams include your primary Cardiologist (physician) and Advanced Practice Providers (APPs -  Physician Assistants and Nurse Practitioners) who all work together to provide you with the care you need, when you need it.  We recommend signing up for the patient portal called "MyChart".  Sign up information is provided on this After Visit Summary.  MyChart is used to connect with patients for Virtual Visits (Telemedicine).  Patients are able to view lab/test results,  encounter notes, upcoming appointments, etc.  Non-urgent messages can be sent to your provider as well.   To learn more about what you can do with MyChart, go to [email or web address].    Your next appointment:   1 month(s)  The format for your next appointment:   In Person  Provider:   Debbe Odea, MD   Other Instructions

## 2020-07-17 ENCOUNTER — Telehealth: Payer: Self-pay | Admitting: Cardiology

## 2020-07-17 LAB — ECHOCARDIOGRAM COMPLETE
AR max vel: 2.45 cm2
AV Area VTI: 2.93 cm2
AV Area mean vel: 2.71 cm2
AV Mean grad: 4 mmHg
AV Peak grad: 8.2 mmHg
Ao pk vel: 1.43 m/s
Area-P 1/2: 3.42 cm2
Calc EF: 54.8 %
Height: 76 in
S' Lateral: 2.9 cm
Single Plane A2C EF: 51.6 %
Single Plane A4C EF: 58.2 %
Weight: 4304 oz

## 2020-07-17 NOTE — Telephone Encounter (Signed)
Spoke with patient and gave him his Echo result note. Patient inquired why his Stress test appointment fell off of his MyChart. I had Sabrina in out front office check on the status and she was told that pre-cert cancelled the appointment because it was not approved by insurance yet. Patient was rescheduled for Friday 07/20/20 at 0800. I informed patient of this, he verbalized understanding and agreed with plan.

## 2020-07-17 NOTE — Telephone Encounter (Signed)
Patient calling for echo results. Please call wifes number,[phone], as patients is having issues now

## 2020-07-19 ENCOUNTER — Ambulatory Visit: Payer: Medicare HMO

## 2020-07-19 ENCOUNTER — Other Ambulatory Visit: Payer: Self-pay

## 2020-07-19 ENCOUNTER — Other Ambulatory Visit
Admission: RE | Admit: 2020-07-19 | Discharge: 2020-07-19 | Disposition: A | Discharge status: Home / Self Care | Payer: Medicare HMO | Source: Ambulatory Visit | Attending: Orthopedic Surgery | Admitting: Orthopedic Surgery

## 2020-07-19 DIAGNOSIS — Z20822 Contact with and (suspected) exposure to covid-19: Secondary | ICD-10-CM | POA: Diagnosis not present

## 2020-07-19 DIAGNOSIS — Z01812 Encounter for preprocedural laboratory examination: Secondary | ICD-10-CM | POA: Insufficient documentation

## 2020-07-19 LAB — IGE: IgE (Immunoglobulin E), Serum: 29 IU/mL (ref 6–495)

## 2020-07-20 ENCOUNTER — Ambulatory Visit: Payer: Medicare HMO

## 2020-07-20 LAB — SARS CORONAVIRUS 2 (TAT 6-24 HRS): SARS Coronavirus 2: NEGATIVE

## 2020-07-20 NOTE — Progress Notes (Signed)
  Perioperative Services Pre-Admission/Anesthesia Testing     Date: 07/20/20  Name: ISADORE PALECEK MRN:   [id number]  Re: Presurgical cardiac evaluation and clearance   Case: [id number] Date/Time: 07/23/20 1512   Procedure: COMPUTER ASSISTED TOTAL KNEE ARTHROPLASTY (Left Knee)   Anesthesia type: Choice   Pre-op diagnosis: PRIMARY OSTEOARTHRITIS OF LEFT KNEE.   Location: [facility] OR ROOM 01 / [facility] ORS FOR ANESTHESIA GROUP   Surgeons: Donato Heinz, MD    Patient is scheduled to undergo the above elective orthopedic procedure on 07/23/2020 with Dr. Francesco Sor.  ECG changes noted on preoperative work-up.  Patient was referred to cardiology.  Patient was seen in consult on 07/16/2020 by Dr. Debbe Odea; notes reviewed.  Patient with no history of heart disease. He noted a single episode of shortness of breath approximately 2 months ago that was associated with diaphoresis.  Patient had no complaints in clinic.  Patient with cardiac risk factors including: age, BMI of 32.74 kg/m, HTN, HLD, T2DM, and (+) FHx of cardiac disease.  ECG repeated by cardiology clinic revealing evidence of a possible old inferior infarction. Of note, inferior TWIs present on the ECG tracing performed in PAT were NOT present on the repeat tracing performed by cardiology (see below).          PAT TRACING (07/13/2020)              CARDIOLOGY TRACING (07/16/2020)            Decision was made to send patient for further noninvasive cardiovascular testing as follows:  IMAGING / PROCEDURES: ECHOCARDIOGRAM performed on 07/16/2020:.  1. LVEF 55-60%  2. The LV has normal function and no regional wall motion abnormalities.  3. There is mild LVH.  4. Left ventricular diastolic parameters are consistent with G1DD (impaired relaxation).  5. RV systolic function is normal.  6. The RV size is normal.  7. Tricuspid regurgitation signal is inadequate for assessing PA pressure.  8. Left atrial size was mildly dilated.  9. The  MV is normal in structure with no regurgitation. No evidence of mitral stenosis.  10. The aortic valve has an indeterminant number of cusps.  11. AV regurgitation is not visualized.  12. Mild to moderate AV sclerosis/calcification is present, without any evidence of aortic stenosis.   LEXISCAN - testing pending insurance approval and still needs to be performed  Impression and Plan:  GUAGE EFFERSON has been seen in consult by cardiology following an abnormal ECG discovered during his PAT appointment. Subsequent TTE has been performed as documented above. Patient is PENDING a myocardial perfusion imaging study, which was scheduled to be done on 07/19/2020. With that being said, the procedure was cancelled and rescheduled for 07/20/2020 due to it still being in review status with [employer]. I have spoken with [facility] this morning and learned that as of 0900 on 07/20/2020, the Eugenie Birks is still under review, and will unfortunately need to be rescheduled. This, in turn means, that cardiology cannot sign off on patient as being cleared for surgery on 07/23/2020. Call placed to Dr. Elenor Legato nurse Perlie Gold, RN) to make her aware that surgery will need to be rescheduled pending cardiac testing and clearance. Copy of this note will also be forwarded to primary attending surgeon Ernest Pine, MD) for an update on the clinical status of this patient.   Quentin Mulling, MSN, APRN, FNP-C, CEN [facility]  Peri-operative Services Nurse Practitioner Phone: [phone] 07/20/20 9:12 AM

## 2020-07-22 NOTE — H&P (Signed)
ORTHOPAEDIC HISTORY & PHYSICAL Michelene Gardener, [place] - 07/13/2020 8:15 AM EDT Formatting of this note is different from the original. [facility] Chief Complaint:   Chief Complaint  Patient presents with  . Knee Pain  H & P LEFT KNEE   History of Present Illness:   Mitchell Case is a 65 y.o. male with past medical history of DM II that presents to clinic today for his preoperative history and evaluation. Patient presents with his wife. The patient is scheduled to undergo a left total knee arthroplasty on 07/23/20 by Dr. Ernest Pine. His pain began 2 years ago without injury. The pain is located along the medial and lateral aspects of the knee. He describes his pain as worse with weightbearing, rising after sitting, standing, and walking. He reports associated swelling with some giving way of the knee. He denies associated numbness or tingling.   The patient's symptoms have progressed to the point that they decrease his quality of life. The patient has previously undergone conservative treatment including NSAIDS and injections to the knee without adequate control of his symptoms.  Reports penicillin allergy as child. Denies significant cardiac history, lower back surgery, or history of blood clots.  Last A1C was 7.4 on 05/10/20.  Past Medical, Surgical, Family, Social History, Allergies, Medications:   Past Medical History:  Past Medical History:  Diagnosis Date  . Anesthesia complication  hiccups for one hour postop- 3 surgeries, shivering after lithotripsy  . ARMD (age-related macular degeneration), bilateral  non-neovascular AMD OU; atypical an early onset w/ GA  . Arthritis  . Diabetes mellitus type 2, uncomplicated (CMS-HCC)  diet controlled  . Difficult airway  see 11-11-12 anesthesia note  . Diverticulosis  . Encounter for blood transfusion  . History of hepatitis  age 9 (pt states he had strep throat & mono & told he also had  hepatitis)  . Hyperlipidemia  . Hypertension  . Kidney stone  h/o  . Sleep apnea  cpap-   Past Surgical History:  Past Surgical History:  Procedure Laterality Date  . ACHILLES TENDON REPAIR  . COLONOSCOPY  . COLONOSCOPY N/A 01/03/2014  Procedure: Colonoscopy; Surgeon: Rush Landmark, MD; Location: [facility] ENDO/BRONCH; Service: Gastroenterology; Laterality: N/A;  . COLONOSCOPY 01/04/2014  Procedure: COLONOSCOPY, FLEXIBLE; DIAGNOSTIC, INCLUDING COLLECTION OF SPECIMEN(S) BY BRUSHING OR WASHING, WHEN PERFORMED (SEPARATE PROCEDURE); Surgeon: Rush Landmark, MD; Location: [facility] ENDO/BRONCH; Service: Gastroenterology;;  . CYSTOURETHROSCOPY W/INSERTION URETHRAL STENT Left 10/24/2014  Procedure: CYSTOURETHROSCOPY WITH INSERTION URETHRAL STENT; Surgeon: Toney Reil, MD; Location: [facility] OR; Service: Urology; Laterality: Left;  . EGD N/A 01/04/2014  Procedure: EGD; Surgeon: Rush Landmark, MD; Location: [facility] ENDO/BRONCH; Service: Gastroenterology; Laterality: N/A;  . INTRAOPERATIVE FLUOROSCOPY Right 11/11/2012  Procedure: FLUOROSCOPE EXAM >1 HR EXTENSIVE; Surgeon: Jac Canavan, MD; Location: [facility] OR; Service: Urology; Laterality: Right;  . INTRAOPERATIVE FLUOROSCOPY Left 12/21/2014  Procedure: FLUOROSCOPY, PHYSICIAN OR OTHER QUALIFIED HEALTH CARE PROFESSIONAL TIME MORE THAN 1 HOUR, ASSISTING A NONRADIOLOGIC PHYSICIAN OR OTHER QUALIFIED HEALTH CARE PROFESSIONAL; Surgeon: Jac Canavan, MD; Location: [facility] OR; Service: Urology; Laterality: Left;  . INTRAOPERATIVE FLUOROSCOPY Left 07/16/2017  Procedure: FLUOROSCOPY; Surgeon: Jac Canavan, MD; Location: [facility] OR; Service: Urology; Laterality: Left;  . PR CYSTO/URETERO W/LITHOTRIPSY &INDWELL STENT INSRT Right 11/11/2012  Procedure: Cystoscopy/Right Ureteroscopy with Holmium Laser-Basket Stone Extraction/Right Retrograde Pyelogram/Right JJ Stent; Surgeon: Jac Canavan, MD; Location: [facility] OR; Service: Urology; Laterality: Right;  . PR  CYSTO/URETERO W/LITHOTRIPSY &INDWELL STENT INSRT  Left 12/21/2014  Procedure: Cystoscopy/Left Ureteroscopy with Holmium Laser/Left JJ Stent (2nd Stage); Surgeon: Jac Canavan, MD; Location: [facility] OR; Service: Urology; Laterality: Left;  . PR CYSTO/URETERO W/LITHOTRIPSY &INDWELL STENT INSRT Left 07/16/2017  Procedure: Cystoscopy/Left Distal Ureteroscopy with Holmium Laser-Basket Stone Extraction/Left Retrograde Pyelogram/Left JJ Stent; Surgeon: Jac Canavan, MD; Location: [facility] OR; Service: Urology; Laterality: Left;   Current Medications:  Current Outpatient Medications  Medication Sig Dispense Refill  . aspirin 81 MG EC tablet Take 81 mg by mouth nightly  . BD ULTRA-FINE MICRO PEN NEEDLE 32 gauge x 1/4" needle USE WITH VICTOZA ONCE DAILY 100 each 3  . beta-carotene,A,-vits C,E/mins (OCUVITE ORAL) Take 1 tablet by mouth nightly  . ibuprofen (MOTRIN) 600 MG tablet Take 600 mg by mouth every 6 (six) hours as needed for Pain  . liraglutide (VICTOZA 3-PAK) 0.6 mg/0.1 mL (18 mg/3 mL) pen injector Inject 0.6 mg subcutaneously once daily 9 mL 3  . lisinopriL (ZESTRIL) 10 MG tablet Take 1 tablet (10 mg total) by mouth once daily 90 tablet 3  . metFORMIN (GLUCOPHAGE) 1000 MG tablet Take 1 tablet (1,000 mg total) by mouth 2 (two) times daily with meals 180 tablet 3  . rosuvastatin (CRESTOR) 5 MG tablet Take 1 tablet (5 mg total) by mouth once a week 13 tablet 3  . tamsulosin (FLOMAX) 0.4 mg capsule Take 0.4 mg by mouth nightly Take 30 minutes after same meal each day.   No current facility-administered medications for this visit.   Allergies:  Allergies  Allergen Reactions  . Penicillins Swelling  Swelling and itching of hands as a child, no issues breathing  . Atorvastatin Muscle Pain  . Hydrochlorothiazide (Bulk) Itching  . Sulfa (Sulfonamide Antibiotics) Itching   Social History:  Social History   Socioeconomic History  . Marital status: Married  Spouse name: Selena Batten  . Number of  children: 2  . Years of education: 73  . Highest education level: Not on file  Occupational History  . Occupation: RetiredMetallurgist  Tobacco Use  . Smoking status: Former Smoker  Years: 10.00  Types: Cigars  Quit date: 11/11/1978  Years since quitting: 41.6  . Smokeless tobacco: Former Neurosurgeon  Types: Snuff, Chew  . Tobacco comment: Occasional cigars  Vaping Use  . Vaping Use: Never used  Substance and Sexual Activity  . Alcohol use: No  . Drug use: No  . Sexual activity: Yes  Partners: Female  Other Topics Concern  . Not on file  Social History Narrative  Married, lives wife and two dogs. One grown son and one grown daughter. Four grandchildren. Grandchildren live very close by. 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   Social Determinants of Health   Financial Resource Strain: Not on file  Food Insecurity: Not on file  Transportation Needs: Not on file  Physical Activity: Not on file  Stress: Not on file  Social Connections: Not on file  Housing Stability: Not on file   Family History:  Family History  Problem Relation Age of Onset  . Diabetes Mother  . Brain cancer Mother  at age 32  . Coronary Artery Disease (Blocked arteries around heart) Mother  had MI and CABG (<50 yo)  . Diabetes type II Mother  . Diabetes Father  . Hodgkin's lymphoma Father  in remission  . Diabetes type II Father  . Diabetes Brother  . Diabetes type II Brother  . Diabetes Maternal Grandmother  . Diabetes type II Maternal Grandmother  .  Diabetes Maternal Grandfather  . Diabetes type II Maternal Grandfather   Review of Systems:   A 10+ ROS was performed, reviewed, and the pertinent orthopaedic findings are documented in the HPI.   Physical Examination:   BP (!) 140/90 (BP Location: Left upper arm, Patient Position: Sitting, BP Cuff Size: Adult)  Ht 193 cm (6\' 4" )  Wt (!) 121.7 kg (268 lb 3.2 oz)  BMI 32.65 kg/m   Patient is a well-developed, well-nourished male in no acute  distress. Patient has normal mood and affect. Patient is alert and oriented to person, place, and time.   HEENT: Atraumatic, normocephalic. Pupils equal and reactive to light. Extraocular motion intact. Noninjected sclera.  Cardiovascular: Regular rate and rhythm, with no murmurs, rubs, or gallops. Distal pulses palpable.  Respiratory: Lungs clear to auscultation bilaterally.   Left Knee: Soft tissue swelling: minimal Effusion: none Erythema: none Crepitance: mild Tenderness: medial, anterior Alignment: relative varus Mediolateral laxity: medial pseudolaxity Posterior sag: negative Patellar tracking: Good tracking without evidence of subluxation or tilt Atrophy: No significant atrophy.  Quadriceps tone was good. Range of motion: 0/3/120 degrees   Sensation intact over the saphenous, lateral sural cutaneous, superficial fibular, and deep fibular nerve distributions.  Tests Performed/Reviewed:  X-rays  No new radiographs were obtained today. Previous radiographs were reviewed of the left knee and revealed severe loss of medial compartment joint space with osteophyte formation and subchondral sclerosis noted. Lateral compartment reveals well-preserved. Mild loss of patellofemoral joint space noted. Calcification of the patella tendon noted on lateral view.  Impression:   ICD-10-CM  1. Primary osteoarthritis of left knee M17.12   Plan:   The patient has end-stage degenerative changes of the left knee. It was explained to the patient that the condition is progressive in nature. Having failed conservative treatment, the patient has elected to proceed with a total joint arthroplasty. The patient will undergo a total joint arthroplasty with Dr. 11-06-2005. The risks of surgery, including blood clot and infection, were discussed with the patient. Measures to reduce these risks, including the use of anticoagulation, perioperative antibiotics, and early ambulation were discussed. The importance  of postoperative physical therapy was discussed with the patient. The patient elects to proceed with surgery. The patient is instructed to stop all blood thinners prior to surgery. The patient is instructed to call the hospital the day before surgery to learn of the proper arrival time.   Contact our office with any questions or concerns. Follow up as indicated, or sooner should any new problems arise, if conditions worsen, or if they are otherwise concerned.   Ernest Pine, PA-C [facility] [address] [place], [place] Phone: [phone]  This note was generated in part with voice recognition software and I apologize for any typographical errors that were not detected and corrected.  Electronically signed by [phone], PA at 07/13/2020 10:18 PM EDT

## 2020-07-24 ENCOUNTER — Encounter
Admission: RE | Admit: 2020-07-24 | Discharge: 2020-07-24 | Disposition: A | Discharge status: Home / Self Care | Payer: Medicare HMO | Source: Ambulatory Visit | Attending: Cardiology | Admitting: Cardiology

## 2020-07-24 ENCOUNTER — Other Ambulatory Visit: Payer: Self-pay

## 2020-07-24 DIAGNOSIS — Z01818 Encounter for other preprocedural examination: Secondary | ICD-10-CM | POA: Diagnosis not present

## 2020-07-24 DIAGNOSIS — R9431 Abnormal electrocardiogram [ECG] [EKG]: Secondary | ICD-10-CM | POA: Diagnosis present

## 2020-07-24 LAB — NM MYOCAR MULTI W/SPECT W/WALL MOTION / EF
LV dias vol: 106 mL (ref 62–150)
LV sys vol: 43 mL
Peak HR: 99 {beats}/min
Percent HR: 63 %
Rest HR: 72 {beats}/min
TID: 1.07

## 2020-07-24 MED ORDER — TECHNETIUM TC 99M TETROFOSMIN IV KIT
10.8100 | PACK | Freq: Once | INTRAVENOUS | Status: AC | PRN
  Administered 2020-07-24: 10.81 via INTRAVENOUS

## 2020-07-24 NOTE — Telephone Encounter (Signed)
Patients Myoview was re-scheduled for 07/24/20 as the authorization was not approved until yesterday.

## 2020-07-24 NOTE — Progress Notes (Signed)
  Perioperative Services Pre-Admission/Anesthesia Testing   Date: 07/24/20  Name: Mitchell Case MRN:   [id number]  Re: Results from [facility] and plans for surgery   Case: [id number] Date/Time: 07/27/20 0700   Procedure: COMPUTER ASSISTED TOTAL KNEE ARTHROPLASTY (Left Knee)   Anesthesia type: Choice   Pre-op diagnosis: PRIMARY OSTEOARTHRITIS OF LEFT KNEE.   Location: [facility] OR ROOM 01 / [facility] ORS FOR ANESTHESIA GROUP   Surgeons: Donato Heinz, MD     Patient is scheduled for the above procedure on 07/27/2020 with Dr. Francesco Sor.  Of note, this procedure was rescheduled from initial date due to need for further cardiovascular testing and subsequent cardiac clearance.  Due to insurance issues, patient's myocardial perfusion imaging study was delayed until 07/24/2020.  Received call from primary attending surgeon's office to discuss patient status and determine clearance for patient's surgery on 07/27/2020.  Patient underwent myocardial perfusion imaging study on 07/24/2020. Study results reviewed as follows:   TWIs in the inferior (II, III, aVF) and anterolateral (V3, V4, V5, V6) leads during stress. Patient reported to have been somewhat symptomatic during stress; experienced shortness of breath, headache, and facial flushing per technologist notes.     Myocardial perfusion was abnormal at rest and with stress.  REST: small in size, mild in severity, defect present in the apical inferior and apical lateral defect consistent with attenuation, however cardiologist noted that small region of scar could not be excluded.     STRESS: small in size, mild in severity, defect present in the mid anteroseptal, apical anterior, apical inferior, and apical lateral location   There was no significant coronary artery calcification, however mild aortic atherosclerosis, and aortic valvular calcification was noted.     The LVEF was normal (54% by QGS and 59% Siemens).     There was no evidence of  significant ischemia noted. Cardiology interpreting study as a low risk, probably normal, pharmacological myocardial perfusion test.  Impression and Plan:  Mitchell Case has been referred for pre-anesthesia review and clearance prior to him undergoing the planned anesthetic and procedural courses. Available labs, pertinent testing, and imaging results were personally reviewed by me.  I have spoken with consulting cardiologist (Azucena Cecil, MD) who advises that this patient is cleared to proceed with planned surgical intervention with an overall ACCEPTABLE risk of perioperative cardiovascular complications. Call placed to primary attending surgeon's office and message left for Dr. Elenor Legato staff Perlie Gold, RN). Will also froward a copy of this note to Dr. Ernest Pine for review.   Based on clinical review of non-invasive cardiovascular testing, barring any significant acute changes in the patient's overall condition, it is anticipated that he will be able to proceed with the planned surgical intervention. Any acute changes in clinical condition may necessitate his procedure being postponed and/or cancelled. Pre-surgical instructions were previously reviewed with the patient during his PAT appointment and questions were fielded by PAT clinical staff.  Quentin Mulling, MSN, APRN, FNP-C, CEN [facility]  Peri-operative Services Nurse Practitioner Phone: [phone] 07/25/20 8:34 AM  NOTE: This note has been prepared using Dragon dictation software. Despite my best ability to proofread, there is always the potential that unintentional transcriptional errors may still occur from this process.

## 2020-07-25 ENCOUNTER — Other Ambulatory Visit
Admission: RE | Admit: 2020-07-25 | Discharge: 2020-07-25 | Disposition: A | Discharge status: Home / Self Care | Payer: Medicare HMO | Source: Ambulatory Visit | Attending: Orthopedic Surgery | Admitting: Orthopedic Surgery

## 2020-07-25 DIAGNOSIS — Z20822 Contact with and (suspected) exposure to covid-19: Secondary | ICD-10-CM | POA: Insufficient documentation

## 2020-07-25 DIAGNOSIS — Z01812 Encounter for preprocedural laboratory examination: Secondary | ICD-10-CM | POA: Diagnosis present

## 2020-07-25 LAB — SARS CORONAVIRUS 2 (TAT 6-24 HRS): SARS Coronavirus 2: NEGATIVE

## 2020-07-26 ENCOUNTER — Encounter: Payer: Self-pay | Admitting: Orthopedic Surgery

## 2020-07-26 DIAGNOSIS — N200 Calculus of kidney: Secondary | ICD-10-CM | POA: Insufficient documentation

## 2020-07-26 DIAGNOSIS — G4730 Sleep apnea, unspecified: Secondary | ICD-10-CM | POA: Insufficient documentation

## 2020-07-26 DIAGNOSIS — H3530 Unspecified macular degeneration: Secondary | ICD-10-CM | POA: Insufficient documentation

## 2020-07-26 DIAGNOSIS — G4733 Obstructive sleep apnea (adult) (pediatric): Secondary | ICD-10-CM | POA: Insufficient documentation

## 2020-07-27 ENCOUNTER — Ambulatory Visit: Payer: Medicare HMO | Admitting: Urgent Care

## 2020-07-27 ENCOUNTER — Other Ambulatory Visit: Payer: Self-pay

## 2020-07-27 ENCOUNTER — Encounter
Admission: RE | Disposition: A | Discharge status: Home / Self Care | Payer: Self-pay | Source: Home / Self Care | Attending: Orthopedic Surgery

## 2020-07-27 ENCOUNTER — Observation Stay
Admission: RE | Admit: 2020-07-27 | Discharge: 2020-07-28 | Disposition: A | Discharge status: Home / Self Care | Payer: Medicare HMO | Attending: Orthopedic Surgery | Admitting: Orthopedic Surgery

## 2020-07-27 ENCOUNTER — Observation Stay: Payer: Medicare HMO

## 2020-07-27 DIAGNOSIS — Z79899 Other long term (current) drug therapy: Secondary | ICD-10-CM | POA: Insufficient documentation

## 2020-07-27 DIAGNOSIS — E119 Type 2 diabetes mellitus without complications: Secondary | ICD-10-CM | POA: Insufficient documentation

## 2020-07-27 DIAGNOSIS — Z96659 Presence of unspecified artificial knee joint: Secondary | ICD-10-CM

## 2020-07-27 DIAGNOSIS — Z87891 Personal history of nicotine dependence: Secondary | ICD-10-CM | POA: Diagnosis not present

## 2020-07-27 DIAGNOSIS — Z7984 Long term (current) use of oral hypoglycemic drugs: Secondary | ICD-10-CM | POA: Diagnosis not present

## 2020-07-27 DIAGNOSIS — M25562 Pain in left knee: Secondary | ICD-10-CM | POA: Diagnosis present

## 2020-07-27 DIAGNOSIS — Z96652 Presence of left artificial knee joint: Secondary | ICD-10-CM

## 2020-07-27 DIAGNOSIS — I10 Essential (primary) hypertension: Secondary | ICD-10-CM | POA: Insufficient documentation

## 2020-07-27 DIAGNOSIS — M1712 Unilateral primary osteoarthritis, left knee: Secondary | ICD-10-CM | POA: Diagnosis not present

## 2020-07-27 DIAGNOSIS — Z7982 Long term (current) use of aspirin: Secondary | ICD-10-CM | POA: Diagnosis not present

## 2020-07-27 HISTORY — DX: Hyperlipidemia, unspecified: E78.5

## 2020-07-27 HISTORY — PX: KNEE ARTHROPLASTY: SHX992

## 2020-07-27 LAB — GLUCOSE, CAPILLARY
Glucose-Capillary: 140 mg/dL — ABNORMAL HIGH (ref 70–99)
Glucose-Capillary: 193 mg/dL — ABNORMAL HIGH (ref 70–99)
Glucose-Capillary: 289 mg/dL — ABNORMAL HIGH (ref 70–99)
Glucose-Capillary: 297 mg/dL — ABNORMAL HIGH (ref 70–99)

## 2020-07-27 LAB — ABO/RH: ABO/RH(D): A POS

## 2020-07-27 SURGERY — ARTHROPLASTY, KNEE, TOTAL, USING IMAGELESS COMPUTER-ASSISTED NAVIGATION
Anesthesia: Spinal | Site: Knee | Laterality: Left

## 2020-07-27 MED ORDER — DIPHENHYDRAMINE HCL 12.5 MG/5ML PO ELIX: 12.5000 mg | ORAL_SOLUTION | ORAL | Status: DC | PRN

## 2020-07-27 MED ORDER — METOCLOPRAMIDE HCL 10 MG PO TABS
10.0000 mg | ORAL_TABLET | Freq: Three times a day (TID) | ORAL | Status: DC
  Administered 2020-07-27 – 2020-07-28 (×5): 10 mg via ORAL
  Filled 2020-07-27 (×5): qty 1

## 2020-07-27 MED ORDER — PROPOFOL 500 MG/50ML IV EMUL
INTRAVENOUS | Status: DC | PRN
  Administered 2020-07-27: 100 ug/kg/min via INTRAVENOUS

## 2020-07-27 MED ORDER — PROPOFOL 1000 MG/100ML IV EMUL
INTRAVENOUS | Status: AC
  Filled 2020-07-27: qty 100

## 2020-07-27 MED ORDER — TRAMADOL HCL 50 MG PO TABS
50.0000 mg | ORAL_TABLET | ORAL | Status: DC | PRN
  Administered 2020-07-27: 50 mg via ORAL
  Filled 2020-07-27: qty 1

## 2020-07-27 MED ORDER — SURGIPHOR WOUND IRRIGATION SYSTEM - OPTIME
TOPICAL | Status: DC | PRN
  Administered 2020-07-27: 1 via TOPICAL

## 2020-07-27 MED ORDER — ACETAMINOPHEN 10 MG/ML IV SOLN
INTRAVENOUS | Status: AC
  Filled 2020-07-27: qty 100

## 2020-07-27 MED ORDER — PHENYLEPHRINE HCL (PRESSORS) 10 MG/ML IV SOLN
INTRAVENOUS | Status: DC | PRN
  Administered 2020-07-27 (×3): 100 ug via INTRAVENOUS

## 2020-07-27 MED ORDER — FAMOTIDINE 20 MG PO TABS: 20.0000 mg | ORAL_TABLET | Freq: Once | ORAL | Status: AC

## 2020-07-27 MED ORDER — DEXAMETHASONE SODIUM PHOSPHATE 10 MG/ML IJ SOLN: 8.0000 mg | Freq: Once | INTRAMUSCULAR | Status: DC

## 2020-07-27 MED ORDER — ROSUVASTATIN CALCIUM 5 MG PO TABS: 5.0000 mg | ORAL_TABLET | ORAL | Status: DC

## 2020-07-27 MED ORDER — TRANEXAMIC ACID-NACL 1000-0.7 MG/100ML-% IV SOLN
INTRAVENOUS | Status: AC
  Filled 2020-07-27: qty 100

## 2020-07-27 MED ORDER — ACETAMINOPHEN 10 MG/ML IV SOLN
1000.0000 mg | Freq: Four times a day (QID) | INTRAVENOUS | Status: AC
  Administered 2020-07-27 – 2020-07-28 (×3): 1000 mg via INTRAVENOUS
  Filled 2020-07-27 (×2): qty 100

## 2020-07-27 MED ORDER — BUPIVACAINE HCL (PF) 0.5 % IJ SOLN
INTRAMUSCULAR | Status: DC | PRN
  Administered 2020-07-27: 3 mL

## 2020-07-27 MED ORDER — TAMSULOSIN HCL 0.4 MG PO CAPS
0.4000 mg | ORAL_CAPSULE | Freq: Every day | ORAL | Status: DC
  Administered 2020-07-27: 0.4 mg via ORAL
  Filled 2020-07-27: qty 1

## 2020-07-27 MED ORDER — TRANEXAMIC ACID-NACL 1000-0.7 MG/100ML-% IV SOLN
1000.0000 mg | Freq: Once | INTRAVENOUS | Status: AC
  Administered 2020-07-27: 1000 mg via INTRAVENOUS

## 2020-07-27 MED ORDER — CEFAZOLIN SODIUM-DEXTROSE 2-4 GM/100ML-% IV SOLN
INTRAVENOUS | Status: AC
  Filled 2020-07-27: qty 100

## 2020-07-27 MED ORDER — PANTOPRAZOLE SODIUM 40 MG PO TBEC
40.0000 mg | DELAYED_RELEASE_TABLET | Freq: Two times a day (BID) | ORAL | Status: DC
  Administered 2020-07-27 – 2020-07-28 (×2): 40 mg via ORAL
  Filled 2020-07-27 (×2): qty 1

## 2020-07-27 MED ORDER — CELECOXIB 200 MG PO CAPS: 400.0000 mg | ORAL_CAPSULE | Freq: Once | ORAL | Status: AC

## 2020-07-27 MED ORDER — SENNOSIDES-DOCUSATE SODIUM 8.6-50 MG PO TABS
1.0000 | ORAL_TABLET | Freq: Two times a day (BID) | ORAL | Status: DC
  Administered 2020-07-27 – 2020-07-28 (×2): 1 via ORAL
  Filled 2020-07-27 (×2): qty 1

## 2020-07-27 MED ORDER — MENTHOL 3 MG MT LOZG
1.0000 | LOZENGE | OROMUCOSAL | Status: DC | PRN
  Filled 2020-07-27: qty 9

## 2020-07-27 MED ORDER — BUPIVACAINE HCL (PF) 0.25 % IJ SOLN
INTRAMUSCULAR | Status: DC | PRN
  Administered 2020-07-27: 60 mL

## 2020-07-27 MED ORDER — PHENOL 1.4 % MT LIQD
1.0000 | OROMUCOSAL | Status: DC | PRN
  Filled 2020-07-27: qty 177

## 2020-07-27 MED ORDER — SODIUM CHLORIDE 0.9 % IR SOLN
Status: DC | PRN
  Administered 2020-07-27: 500 mL

## 2020-07-27 MED ORDER — CELECOXIB 200 MG PO CAPS
200.0000 mg | ORAL_CAPSULE | Freq: Two times a day (BID) | ORAL | Status: DC
  Administered 2020-07-27 – 2020-07-28 (×2): 200 mg via ORAL
  Filled 2020-07-27 (×2): qty 1

## 2020-07-27 MED ORDER — FLEET ENEMA 7-19 GM/118ML RE ENEM: 1.0000 | ENEMA | Freq: Once | RECTAL | Status: DC | PRN

## 2020-07-27 MED ORDER — CELECOXIB 200 MG PO CAPS
ORAL_CAPSULE | ORAL | Status: AC
  Administered 2020-07-27: 400 mg via ORAL
  Filled 2020-07-27: qty 2

## 2020-07-27 MED ORDER — PHENYLEPHRINE HCL (PRESSORS) 10 MG/ML IV SOLN
INTRAVENOUS | Status: AC
  Filled 2020-07-27: qty 1

## 2020-07-27 MED ORDER — CEFAZOLIN SODIUM-DEXTROSE 2-4 GM/100ML-% IV SOLN
2.0000 g | Freq: Four times a day (QID) | INTRAVENOUS | Status: AC
  Administered 2020-07-27 (×2): 2 g via INTRAVENOUS
  Filled 2020-07-27 (×2): qty 100

## 2020-07-27 MED ORDER — ORAL CARE MOUTH RINSE: 15.0000 mL | Freq: Once | OROMUCOSAL | Status: DC

## 2020-07-27 MED ORDER — GABAPENTIN 300 MG PO CAPS
ORAL_CAPSULE | ORAL | Status: AC
  Administered 2020-07-27: 300 mg via ORAL
  Filled 2020-07-27: qty 1

## 2020-07-27 MED ORDER — HYDROMORPHONE HCL 1 MG/ML IJ SOLN
0.5000 mg | INTRAMUSCULAR | Status: DC | PRN
Start: 2020-07-27 — End: 2020-07-28

## 2020-07-27 MED ORDER — CHLORHEXIDINE GLUCONATE 4 % EX LIQD
60.0000 mL | Freq: Once | CUTANEOUS | Status: AC
  Administered 2020-07-27: 4 via TOPICAL

## 2020-07-27 MED ORDER — MIDAZOLAM HCL 2 MG/2ML IJ SOLN
INTRAMUSCULAR | Status: AC
  Filled 2020-07-27: qty 2

## 2020-07-27 MED ORDER — OXYCODONE HCL 5 MG PO TABS
10.0000 mg | ORAL_TABLET | ORAL | Status: DC | PRN
  Administered 2020-07-27 – 2020-07-28 (×5): 10 mg via ORAL
  Filled 2020-07-27 (×5): qty 2

## 2020-07-27 MED ORDER — FAMOTIDINE 20 MG PO TABS
ORAL_TABLET | ORAL | Status: AC
  Administered 2020-07-27: 20 mg via ORAL
  Filled 2020-07-27: qty 1

## 2020-07-27 MED ORDER — SODIUM CHLORIDE 0.9 % IV SOLN: INTRAVENOUS | Status: DC

## 2020-07-27 MED ORDER — MIDAZOLAM HCL 5 MG/5ML IJ SOLN
INTRAMUSCULAR | Status: DC | PRN
  Administered 2020-07-27: 2 mg via INTRAVENOUS

## 2020-07-27 MED ORDER — FLUTICASONE PROPIONATE 50 MCG/ACT NA SUSP
1.0000 | Freq: Every day | NASAL | Status: DC | PRN
  Filled 2020-07-27: qty 16

## 2020-07-27 MED ORDER — OXYCODONE HCL 5 MG PO TABS
5.0000 mg | ORAL_TABLET | ORAL | Status: DC | PRN
Start: 2020-07-27 — End: 2020-07-28

## 2020-07-27 MED ORDER — SODIUM CHLORIDE FLUSH 0.9 % IV SOLN
INTRAVENOUS | Status: AC
  Filled 2020-07-27: qty 40

## 2020-07-27 MED ORDER — NEOMYCIN-POLYMYXIN B GU 40-200000 IR SOLN
Status: AC
  Filled 2020-07-27: qty 2

## 2020-07-27 MED ORDER — ONDANSETRON HCL 4 MG PO TABS: 4.0000 mg | ORAL_TABLET | Freq: Four times a day (QID) | ORAL | Status: DC | PRN

## 2020-07-27 MED ORDER — LISINOPRIL 10 MG PO TABS
10.0000 mg | ORAL_TABLET | Freq: Every day | ORAL | Status: DC
  Administered 2020-07-27: 10 mg via ORAL
  Filled 2020-07-27: qty 1

## 2020-07-27 MED ORDER — CHLORHEXIDINE GLUCONATE 0.12 % MT SOLN
OROMUCOSAL | Status: AC
  Administered 2020-07-27: 15 mL
  Filled 2020-07-27: qty 15

## 2020-07-27 MED ORDER — MAGNESIUM HYDROXIDE 400 MG/5ML PO SUSP
30.0000 mL | Freq: Every day | ORAL | Status: DC
  Administered 2020-07-27: 30 mL via ORAL
  Filled 2020-07-27: qty 30

## 2020-07-27 MED ORDER — FERROUS SULFATE 325 (65 FE) MG PO TABS
325.0000 mg | ORAL_TABLET | Freq: Two times a day (BID) | ORAL | Status: DC
  Administered 2020-07-27 – 2020-07-28 (×2): 325 mg via ORAL
  Filled 2020-07-27 (×2): qty 1

## 2020-07-27 MED ORDER — TRANEXAMIC ACID-NACL 1000-0.7 MG/100ML-% IV SOLN
1000.0000 mg | INTRAVENOUS | Status: AC
  Administered 2020-07-27: 1000 mg via INTRAVENOUS

## 2020-07-27 MED ORDER — BUPIVACAINE HCL (PF) 0.25 % IJ SOLN
INTRAMUSCULAR | Status: AC
  Filled 2020-07-27: qty 60

## 2020-07-27 MED ORDER — SODIUM CHLORIDE 0.9 % IV SOLN
INTRAVENOUS | Status: DC | PRN
  Administered 2020-07-27: 20 ug/min via INTRAVENOUS

## 2020-07-27 MED ORDER — BUPIVACAINE LIPOSOME 1.3 % IJ SUSP
INTRAMUSCULAR | Status: AC
  Filled 2020-07-27: qty 20

## 2020-07-27 MED ORDER — ALUM & MAG HYDROXIDE-SIMETH 200-200-20 MG/5ML PO SUSP: 30.0000 mL | ORAL | Status: DC | PRN

## 2020-07-27 MED ORDER — ACETAMINOPHEN 325 MG PO TABS
325.0000 mg | ORAL_TABLET | Freq: Four times a day (QID) | ORAL | Status: DC | PRN
Start: 2020-07-27 — End: 2020-07-28
  Administered 2020-07-28: 650 mg via ORAL
  Filled 2020-07-27: qty 2

## 2020-07-27 MED ORDER — METFORMIN HCL 500 MG PO TABS
1000.0000 mg | ORAL_TABLET | Freq: Two times a day (BID) | ORAL | Status: DC
  Administered 2020-07-28: 1000 mg via ORAL
  Filled 2020-07-27: qty 2

## 2020-07-27 MED ORDER — CEFAZOLIN SODIUM-DEXTROSE 2-4 GM/100ML-% IV SOLN
2.0000 g | INTRAVENOUS | Status: AC
  Administered 2020-07-27: 2 g via INTRAVENOUS

## 2020-07-27 MED ORDER — INSULIN ASPART 100 UNIT/ML ~~LOC~~ SOLN
0.0000 [IU] | Freq: Three times a day (TID) | SUBCUTANEOUS | Status: DC
  Administered 2020-07-27: 8 [IU] via SUBCUTANEOUS
  Administered 2020-07-28: 3 [IU] via SUBCUTANEOUS
  Administered 2020-07-28: 2 [IU] via SUBCUTANEOUS
  Filled 2020-07-27 (×4): qty 1

## 2020-07-27 MED ORDER — LIRAGLUTIDE 18 MG/3ML ~~LOC~~ SOPN: 0.6000 mg | PEN_INJECTOR | Freq: Every day | SUBCUTANEOUS | Status: DC

## 2020-07-27 MED ORDER — CHLORHEXIDINE GLUCONATE 0.12 % MT SOLN: 15.0000 mL | Freq: Once | OROMUCOSAL | Status: DC

## 2020-07-27 MED ORDER — FAMOTIDINE 20 MG PO TABS
ORAL_TABLET | ORAL | Status: AC
  Filled 2020-07-27: qty 1

## 2020-07-27 MED ORDER — BISACODYL 10 MG RE SUPP: 10.0000 mg | Freq: Every day | RECTAL | Status: DC | PRN

## 2020-07-27 MED ORDER — ENOXAPARIN SODIUM 30 MG/0.3ML ~~LOC~~ SOLN
30.0000 mg | Freq: Two times a day (BID) | SUBCUTANEOUS | Status: DC
  Administered 2020-07-28: 30 mg via SUBCUTANEOUS
  Filled 2020-07-27: qty 0.3

## 2020-07-27 MED ORDER — SODIUM CHLORIDE 0.9 % IV SOLN
INTRAVENOUS | Status: DC | PRN
  Administered 2020-07-27: 60 mL

## 2020-07-27 MED ORDER — ONDANSETRON HCL 4 MG/2ML IJ SOLN: 4.0000 mg | Freq: Four times a day (QID) | INTRAMUSCULAR | Status: DC | PRN

## 2020-07-27 MED ORDER — GABAPENTIN 300 MG PO CAPS: 300.0000 mg | ORAL_CAPSULE | Freq: Once | ORAL | Status: AC

## 2020-07-27 MED ORDER — DEXAMETHASONE SODIUM PHOSPHATE 10 MG/ML IJ SOLN
INTRAMUSCULAR | Status: AC
  Filled 2020-07-27: qty 1

## 2020-07-27 SURGICAL SUPPLY — 74 items
ATTUNE MED DOME PAT 41 KNEE (Knees) ×1 IMPLANT
ATTUNE PS FEM LT CEM SZ9 KNEE (Femur) ×1 IMPLANT
ATTUNE PS RP INSR SZ9 8 KNEE (Femur) ×1 IMPLANT
BASE TIBIAL ATTUNE KNEE SZ9 (Knees) IMPLANT
BATTERY INSTRU NAVIGATION (MISCELLANEOUS) ×8 IMPLANT
BLADE SAW 70X12.5 (BLADE) ×2 IMPLANT
BLADE SAW 90X13X1.19 OSCILLAT (BLADE) ×2 IMPLANT
BLADE SAW 90X25X1.19 OSCILLAT (BLADE) ×2 IMPLANT
BONE CEMENT GENTAMICIN (Cement) ×4 IMPLANT
CEMENT BONE GENTAMICIN 40 (Cement) IMPLANT
COOLER POLAR GLACIER W/PUMP (MISCELLANEOUS) ×2 IMPLANT
COVER WAND RF STERILE (DRAPES) ×2 IMPLANT
CUFF TOURN SGL QUICK 24 (TOURNIQUET CUFF)
CUFF TOURN SGL QUICK 30 (TOURNIQUET CUFF)
CUFF TRNQT CYL 24X4X16.5-23 (TOURNIQUET CUFF) IMPLANT
CUFF TRNQT CYL 30X4X21-28X (TOURNIQUET CUFF) IMPLANT
DRAPE 3/4 80X56 (DRAPES) ×2 IMPLANT
DRESSING PEEL AND PLAC PRVNA20 (GAUZE/BANDAGES/DRESSINGS) ×1 IMPLANT
DRSG DERMACEA 8X12 NADH (GAUZE/BANDAGES/DRESSINGS) ×2 IMPLANT
DRSG MEPILEX SACRM 8.7X9.8 (GAUZE/BANDAGES/DRESSINGS) ×2 IMPLANT
DRSG OPSITE POSTOP 4X14 (GAUZE/BANDAGES/DRESSINGS) ×2 IMPLANT
DRSG PEEL AND PLACE PREVENA 20 (GAUZE/BANDAGES/DRESSINGS) ×2
DRSG TEGADERM 4X4.75 (GAUZE/BANDAGES/DRESSINGS) ×2 IMPLANT
DURAPREP 26ML APPLICATOR (WOUND CARE) ×4 IMPLANT
ELECT CAUTERY BLADE 6.4 (BLADE) ×2 IMPLANT
ELECT REM PT RETURN 9FT ADLT (ELECTROSURGICAL) ×2
ELECTRODE REM PT RTRN 9FT ADLT (ELECTROSURGICAL) ×1 IMPLANT
EX-PIN ORTHOLOCK NAV 4X150 (PIN) ×4 IMPLANT
GLOVE SURG ENC MOIS LTX SZ7.5 (GLOVE) ×4 IMPLANT
GLOVE SURG ENC TEXT LTX SZ7.5 (GLOVE) ×4 IMPLANT
GLOVE SURG UNDER LTX SZ8 (GLOVE) ×2 IMPLANT
GLOVE SURG UNDER POLY LF SZ7.5 (GLOVE) ×2 IMPLANT
GOWN STRL REUS W/ TWL LRG LVL3 (GOWN DISPOSABLE) ×2 IMPLANT
GOWN STRL REUS W/ TWL XL LVL3 (GOWN DISPOSABLE) ×1 IMPLANT
GOWN STRL REUS W/TWL LRG LVL3 (GOWN DISPOSABLE) ×2
GOWN STRL REUS W/TWL XL LVL3 (GOWN DISPOSABLE) ×1
HEMOVAC 400CC 10FR (MISCELLANEOUS) ×2 IMPLANT
HOLDER FOLEY CATH W/STRAP (MISCELLANEOUS) ×2 IMPLANT
HOOD PEEL AWAY FLYTE STAYCOOL (MISCELLANEOUS) ×4 IMPLANT
IRRIGATION SURGIPHOR STRL (IV SOLUTION) ×2 IMPLANT
IV NS IRRIG 3000ML ARTHROMATIC (IV SOLUTION) ×2 IMPLANT
KIT TURNOVER KIT A (KITS) ×2 IMPLANT
KNIFE SCULPS 14X20 (INSTRUMENTS) ×2 IMPLANT
LABEL OR SOLS (LABEL) ×2 IMPLANT
MANIFOLD NEPTUNE II (INSTRUMENTS) ×4 IMPLANT
NDL SAFETY ECLIPSE 18X1.5 (NEEDLE) ×1 IMPLANT
NDL SPNL 20GX3.5 QUINCKE YW (NEEDLE) ×2 IMPLANT
NEEDLE HYPO 18GX1.5 SHARP (NEEDLE) ×1
NEEDLE SPNL 20GX3.5 QUINCKE YW (NEEDLE) ×4 IMPLANT
NS IRRIG 500ML POUR BTL (IV SOLUTION) ×2 IMPLANT
PACK TOTAL KNEE (MISCELLANEOUS) ×2 IMPLANT
PAD WRAPON POLAR KNEE (MISCELLANEOUS) ×1 IMPLANT
PENCIL SMOKE EVACUATOR COATED (MISCELLANEOUS) ×2 IMPLANT
PIN FIXATION 1/8DIA X 3INL (PIN) ×6 IMPLANT
PULSAVAC PLUS IRRIG FAN TIP (DISPOSABLE) ×2
SOL PREP PVP 2OZ (MISCELLANEOUS) ×2
SOLUTION PREP PVP 2OZ (MISCELLANEOUS) ×1 IMPLANT
SPONGE DRAIN TRACH 4X4 STRL 2S (GAUZE/BANDAGES/DRESSINGS) ×2 IMPLANT
STAPLER SKIN PROX 35W (STAPLE) ×2 IMPLANT
STOCKINETTE IMPERV 14X48 (MISCELLANEOUS) IMPLANT
STRAP TIBIA SHORT (MISCELLANEOUS) ×2 IMPLANT
SUCTION FRAZIER HANDLE 10FR (MISCELLANEOUS) ×1
SUCTION TUBE FRAZIER 10FR DISP (MISCELLANEOUS) ×1 IMPLANT
SUT VIC AB 0 CT1 36 (SUTURE) ×4 IMPLANT
SUT VIC AB 1 CT1 36 (SUTURE) ×4 IMPLANT
SUT VIC AB 2-0 CT2 27 (SUTURE) ×2 IMPLANT
SYR 20ML LL LF (SYRINGE) ×2 IMPLANT
SYR 30ML LL (SYRINGE) ×4 IMPLANT
TIBIAL BASE ATTUNE KNEE SZ9 (Knees) ×2 IMPLANT
TIP FAN IRRIG PULSAVAC PLUS (DISPOSABLE) ×1 IMPLANT
TOWEL OR 17X26 4PK STRL BLUE (TOWEL DISPOSABLE) ×2 IMPLANT
TOWER CARTRIDGE SMART MIX (DISPOSABLE) ×2 IMPLANT
TRAY FOLEY MTR SLVR 16FR STAT (SET/KITS/TRAYS/PACK) ×2 IMPLANT
WRAPON POLAR PAD KNEE (MISCELLANEOUS) ×2

## 2020-07-27 NOTE — Op Note (Signed)
OPERATIVE NOTE  DATE OF SURGERY:  07/27/2020  PATIENT NAME:  Mitchell Case   DOB: 12-Apr-1956  MRN: [id number]  PRE-OPERATIVE DIAGNOSIS: Degenerative arthrosis of the left knee, primary  POST-OPERATIVE DIAGNOSIS:  Same  PROCEDURE:  Left total knee arthroplasty using computer-assisted navigation  SURGEON:  Jena Gauss. M.D.  ASSISTANT: Baldwin Jamaica, PA-C (present and scrubbed throughout the case, critical for assistance with exposure, retraction, instrumentation, and closure)  ANESTHESIA: spinal  ESTIMATED BLOOD LOSS: 50 mL  FLUIDS REPLACED: 1200 mL of crystalloid  TOURNIQUET TIME: 111 minutes  DRAINS: 2 medium Hemovac drains  SOFT TISSUE RELEASES: Anterior cruciate ligament, posterior cruciate ligament, deep medial collateral ligament, patellofemoral ligament  IMPLANTS UTILIZED: DePuy Attune size 9 posterior stabilized femoral component (cemented), size 9 rotating platform tibial component (cemented), 41 mm medialized dome patella (cemented), and an 8 mm stabilized rotating platform polyethylene insert.  INDICATIONS FOR SURGERY: Mitchell Case is a 65 y.o. year old male with a long history of progressive knee pain. X-rays demonstrated severe degenerative changes in tricompartmental fashion. The patient had not seen any significant improvement despite conservative nonsurgical intervention. After discussion of the risks and benefits of surgical intervention, the patient expressed understanding of the risks benefits and agree with plans for total knee arthroplasty.   The risks, benefits, and alternatives were discussed at length including but not limited to the risks of infection, bleeding, nerve injury, stiffness, blood clots, the need for revision surgery, cardiopulmonary complications, among others, and they were willing to proceed.  PROCEDURE IN DETAIL: The patient was brought into the operating room and, after adequate spinal anesthesia was achieved, a tourniquet was placed on the  patient's upper thigh. The patient's knee and leg were cleaned and prepped with alcohol and DuraPrep and draped in the usual sterile fashion. A "timeout" was performed as per usual protocol. The lower extremity was exsanguinated using an Esmarch, and the tourniquet was inflated to 300 mmHg. An anterior longitudinal incision was made followed by a standard mid vastus approach. The deep fibers of the medial collateral ligament were elevated in a subperiosteal fashion off of the medial flare of the tibia so as to maintain a continuous soft tissue sleeve. The patella was subluxed laterally and the patellofemoral ligament was incised. Inspection of the knee demonstrated severe degenerative changes with full-thickness loss of articular cartilage. Osteophytes were debrided using a rongeur. Anterior and posterior cruciate ligaments were excised. Two 4.0 mm Schanz pins were inserted in the femur and into the tibia for attachment of the array of trackers used for computer-assisted navigation. Hip center was identified using a circumduction technique. Distal landmarks were mapped using the computer. The distal femur and proximal tibia were mapped using the computer. The distal femoral cutting guide was positioned using computer-assisted navigation so as to achieve a 5 distal valgus cut. The femur was sized and it was felt that a size 9 femoral component was appropriate. A size 9 femoral cutting guide was positioned and the anterior cut was performed and verified using the computer. This was followed by completion of the posterior and chamfer cuts. Femoral cutting guide for the central box was then positioned in the center box cut was performed.  Attention was then directed to the proximal tibia. Medial and lateral menisci were excised. The extramedullary tibial cutting guide was positioned using computer-assisted navigation so as to achieve a 0 varus-valgus alignment and 3 posterior slope. The cut was performed and  verified using the computer. The proximal tibia was  sized and it was felt that a size 9 tibial tray was appropriate. Tibial and femoral trials were inserted followed by insertion of an 8 mm polyethylene insert. This allowed for excellent mediolateral soft tissue balancing both in flexion and in full extension. Finally, the patella was cut and prepared so as to accommodate a 41 mm medialized dome patella. A patella trial was placed and the knee was placed through a range of motion with excellent patellar tracking appreciated. The femoral trial was removed after debridement of posterior osteophytes. The central post-hole for the tibial component was reamed followed by insertion of a keel punch. Tibial trials were then removed. Cut surfaces of bone were irrigated with copious amounts of normal saline using pulsatile lavage and then suctioned dry. Polymethylmethacrylate cement was prepared in the usual fashion using a vacuum mixer. Cement was applied to the cut surface of the proximal tibia as well as along the undersurface of a size 9 rotating platform tibial component. Tibial component was positioned and impacted into place. Excess cement was removed using Personal assistant. Cement was then applied to the cut surfaces of the femur as well as along the posterior flanges of the size 9 femoral component. The femoral component was positioned and impacted into place. Excess cement was removed using Personal assistant. An 8 mm polyethylene trial was inserted and the knee was brought into full extension with steady axial compression applied. Finally, cement was applied to the backside of a 41 mm medialized dome patella and the patellar component was positioned and patellar clamp applied. Excess cement was removed using Personal assistant. After adequate curing of the cement, the tourniquet was deflated after a total tourniquet time of 111 minutes. Hemostasis was achieved using electrocautery. The knee was irrigated with copious  amounts of normal saline using pulsatile lavage followed by 500 ml of Surgiphor and then suctioned dry. 20 mL of 1.3% Exparel and 60 mL of 0.25% Marcaine in 40 mL of normal saline was injected along the posterior capsule, medial and lateral gutters, and along the arthrotomy site. An 8 mm stabilized rotating platform polyethylene insert was inserted and the knee was placed through a range of motion with excellent mediolateral soft tissue balancing appreciated and excellent patellar tracking noted. 2 medium drains were placed in the wound bed and brought out through separate stab incisions. The medial parapatellar portion of the incision was reapproximated using interrupted sutures of #1 Vicryl. Subcutaneous tissue was approximated in layers using first #0 Vicryl followed #2-0 Vicryl. The skin was approximated with skin staples. A sterile dressing was applied.  The patient tolerated the procedure well and was transported to the recovery room in stable condition.    Shanik Brookshire P. Angie Fava., M.D.

## 2020-07-27 NOTE — Transfer of Care (Signed)
Immediate Anesthesia Transfer of Care Note  Patient: Mitchell Case  Procedure(s) Performed: COMPUTER ASSISTED TOTAL KNEE ARTHROPLASTY (Left Knee)  Patient Location: PACU  Anesthesia Type:Spinal  Level of Consciousness: drowsy  Airway & Oxygen Therapy: Patient Spontanous Breathing and Patient connected to face mask oxygen  Post-op Assessment: Report given to RN and Post -op Vital signs reviewed and stable  Post vital signs: Reviewed and stable  Last Vitals:  Vitals Value Taken Time  BP 113/86 07/27/20 1133  Temp    Pulse 81 07/27/20 1137  Resp 7 07/27/20 1137  SpO2 98 % 07/27/20 1137  Vitals shown include unvalidated device data.  Last Pain:  Vitals:   07/27/20 3013  TempSrc: Temporal  PainSc: 0-No pain      Patients Stated Pain Goal: 0 (07/27/20 1438)  Complications: No complications documented.

## 2020-07-27 NOTE — H&P (Signed)
The patient has been re-examined, and the chart reviewed, and there have been no interval changes to the documented history and physical.    The risks, benefits, and alternatives have been discussed at length. The patient expressed understanding of the risks benefits and agreed with plans for surgical intervention.  Lantz Hermann P. Carinna Newhart, Jr. M.D.    

## 2020-07-27 NOTE — Anesthesia Procedure Notes (Signed)
Date/Time: 07/27/2020 7:32 AM Performed by: Manning Charity, CRNA Pre-anesthesia Checklist: Patient identified, Emergency Drugs available, Suction available, Patient being monitored and Timeout performed Oxygen Delivery Method: Simple face mask

## 2020-07-27 NOTE — Progress Notes (Signed)
Met with the patient at the bedside to discuss DC plan and needs with the patient and his wife, He needs a RW, adapt will deliver to theroom prior to DC home, The patient is set up with [facility] for [facility] PT  He can afford his medication and has transportation to the Dr

## 2020-07-27 NOTE — Plan of Care (Signed)

## 2020-07-27 NOTE — Evaluation (Signed)
Physical Therapy Evaluation Patient Details Name: Mitchell Case MRN: [id number] DOB: 03-07-1956 Today's Date: 07/27/2020   History of Present Illness  Pt is a 65 yo male diagnosed with degenerative arthrosis of the left knee and is s/p elective L TKA. PMH includes: HTN and DM.    Clinical Impression  Pt was pleasant and motivated to participate during the session and overall performed very well especially considering POD#0 status.  Pt did not require physical assistance with any functional task and was able to amb 15 feet with a RW with step-to pattern.  Of note, pt did present with mild L knee buckling on one occurrence during SLS phase but was able to self-correct. Pt reported no adverse symptoms during the session other than mild L knee pain with HR and SpO2 WNL. Pt will benefit from HHPT upon discharge to safely address deficits listed in patient problem list for decreased caregiver assistance and eventual return to PLOF.      Follow Up Recommendations Home health PT;Supervision for mobility/OOB    Equipment Recommendations  Rolling walker with 5" wheels;Other (comment) (TBD if pt will neet BSC, has an elevated toilet with vanity and tub to either side to assist with standing)    Recommendations for Other Services       Precautions / Restrictions Precautions Precautions: Fall Restrictions Weight Bearing Restrictions: Yes LLE Weight Bearing: Weight bearing as tolerated Other Position/Activity Restrictions: Pt able to perform Ind LLE SLR without extensor lag, no KI required      Mobility  Bed Mobility Overal bed mobility: Modified Independent             General bed mobility comments: Min extra time and effort only    Transfers Overall transfer level: Needs assistance Equipment used: Rolling walker (2 wheeled) Transfers: Sit to/from Stand Sit to Stand: Min guard         General transfer comment: Mod verbal and visual cues for  sequencing  Ambulation/Gait Ambulation/Gait assistance: Min guard Gait Distance (Feet): 15 Feet Assistive device: Rolling walker (2 wheeled) Gait Pattern/deviations: Step-to pattern Gait velocity: decreased   General Gait Details: Mod verbal and visual cues for step-to sequencing with one instance of mild L knee buckling during SLS phase but with pt able to self-correct without physical assistance  Stairs            Wheelchair Mobility    Modified Rankin (Stroke Patients Only)       Balance Overall balance assessment: Needs assistance   Sitting balance-Leahy Scale: Normal     Standing balance support: Bilateral upper extremity supported;During functional activity Standing balance-Leahy Scale: Good Standing balance comment: Min lean on the RW for support                             Pertinent Vitals/Pain Pain Assessment: 0-10 Pain Score: 3  Pain Location: L knee Pain Descriptors / Indicators: Sore Pain Intervention(s): Premedicated before session;Monitored during session    Home Living Family/patient expects to be discharged to:: Private residence Living Arrangements: Spouse/significant other Available Help at Discharge: Family;Available 24 hours/day Type of Home: House Home Access: Stairs to enter Entrance Stairs-Rails: Right;Left (Too wide for both) Entrance Stairs-Number of Steps: 3 Home Layout: One level   Additional Comments: Owns a walking stick    Prior Function Level of Independence: Independent         Comments: Ind amb community distances without an AD, uses a walking stick for  extreme distances or when walking in the woods, active including yard work, one fall in the last 6 months secondary to slipping on a wet ramp, Ind with ADLs     Hand Dominance        Extremity/Trunk Assessment   Upper Extremity Assessment Upper Extremity Assessment: Overall WFL for tasks assessed    Lower Extremity Assessment Lower Extremity  Assessment: Generalized weakness;LLE deficits/detail LLE Deficits / Details: BLE ankle AROM, strength, and sensation to light touch WNL;  L hip flex strength >/= 3/5 LLE: Unable to fully assess due to pain LLE Sensation: WNL LLE Coordination: WNL       Communication   Communication: No difficulties  Cognition Arousal/Alertness: Awake/alert Behavior During Therapy: WFL for tasks assessed/performed Overall Cognitive Status: Within Functional Limits for tasks assessed                                        General Comments      Exercises Total Joint Exercises Ankle Circles/Pumps: AROM;Strengthening;Both;10 reps Quad Sets: AROM;Strengthening;Left;5 reps;10 reps Straight Leg Raises: AROM;Strengthening;Left;5 reps Long Arc Quad: AROM;Strengthening;Left;10 reps;15 reps Knee Flexion: AROM;Strengthening;Left;10 reps;15 reps Goniometric ROM: L knee AROM: 0-93 deg Marching in Standing: AROM;Strengthening;Both;5 reps;Standing Other Exercises Other Exercises: Positioning education to promote L knee ext PROM Other Exercises: HEP education per handout Other Exercises: Multi-modal cues during education for step-to sequencing during gait training   Assessment/Plan    PT Assessment Patient needs continued PT services  PT Problem List Decreased strength;Decreased range of motion;Decreased activity tolerance;Decreased balance;Decreased mobility;Decreased knowledge of use of DME;Pain       PT Treatment Interventions DME instruction;Gait training;Stair training;Functional mobility training;Therapeutic activities;Therapeutic exercise;Balance training;Patient/family education    PT Goals (Current goals can be found in the Care Plan section)  Acute Rehab PT Goals Patient Stated Goal: To amb better PT Goal Formulation: With patient Time For Goal Achievement: 08/09/20 Potential to Achieve Goals: Good    Frequency BID   Barriers to discharge        Co-evaluation                AM-PAC PT "6 Clicks" Mobility  Outcome Measure Help needed turning from your back to your side while in a flat bed without using bedrails?: A Little Help needed moving from lying on your back to sitting on the side of a flat bed without using bedrails?: A Little Help needed moving to and from a bed to a chair (including a wheelchair)?: A Little Help needed standing up from a chair using your arms (e.g., wheelchair or bedside chair)?: A Little Help needed to walk in hospital room?: A Little Help needed climbing 3-5 steps with a railing? : A Little 6 Click Score: 18    End of Session Equipment Utilized During Treatment: Gait belt Activity Tolerance: Patient tolerated treatment well Patient left: in chair;Other (comment) (Pt left with OT who entered room for OT evaluation at end of session) Nurse Communication: Mobility status;Weight bearing status PT Visit Diagnosis: Muscle weakness (generalized) (M62.81);Other abnormalities of gait and mobility (R26.89);Pain Pain - Right/Left: Left Pain - part of body: Knee    Time: 1515-1600 PT Time Calculation (min) (ACUTE ONLY): 45 min   Charges:   PT Evaluation $PT Eval Moderate Complexity: 1 Mod PT Treatments $Therapeutic Exercise: 8-22 mins $Therapeutic Activity: 8-22 mins        D. Elly Modena PT, DPT 07/27/20, 4:17  PM   

## 2020-07-27 NOTE — Evaluation (Signed)
Occupational Therapy Evaluation Patient Details Name: Mitchell Case MRN: [id number] DOB: [birth year] Today's Date: 07/27/2020    History of Present Illness Pt is a 65 yo male diagnosed with degenerative arthrosis of the left knee and is s/p elective L TKA. PMH includes: HTN and DM.   Clinical Impression   Mitchell Case seen for OT evaluation this date, POD#0 from above surgery. Pt was independent in all ADLs prior to surgery, however occasionally using a walking stick for ambulation, due to L knee pain as well as visual impairments. Pt lives in a [place] home, [address], family available 24/7 to provide any needed assistance during the pt's recovery. Pt is eager to return to PLOF with less pain. Pt currently requires CGA/MinA for transfers/OOB mobility, Mod I for bed mobility. Pt, spouse, and daughter instructed in polar care mgt, falls prevention strategies, home/routines modifications, DME/AE for LB bathing and dressing tasks, and compression stocking mgt. They express understanding and provide teach-back. Do not currently anticipate any OT needs following this hospitalization; pt and family in agreement.      Follow Up Recommendations  No OT follow up    Equipment Recommendations       Recommendations for Other Services       Precautions / Restrictions Precautions Precautions: Fall Restrictions Weight Bearing Restrictions: Yes LLE Weight Bearing: Weight bearing as tolerated Other Position/Activity Restrictions: Pt able to perform Ind LLE SLR without extensor lag, no KI required      Mobility Bed Mobility Overal bed mobility: Modified Independent             General bed mobility comments: Min extra time and effort only    Transfers Overall transfer level: Needs assistance Equipment used: Rolling walker (2 wheeled) Transfers: Sit to/from Stand Sit to Stand: Min guard         General transfer comment: Mod verbal and visual cues for sequencing    Balance Overall balance  assessment: Needs assistance   Sitting balance-Leahy Scale: Normal     Standing balance support: Bilateral upper extremity supported;During functional activity Standing balance-Leahy Scale: Good Standing balance comment: Min lean on the RW for support                           ADL either performed or assessed with clinical judgement   ADL Overall ADL's : Needs assistance/impaired                         Toilet Transfer: Minimal assistance   Toileting- Clothing Manipulation and Hygiene: Min guard               Vision Baseline Vision/History: Macular Degeneration Patient Visual Report: Central vision impairment       Perception     Praxis      Pertinent Vitals/Pain Pain Assessment: 0-10 Pain Score: 3  Pain Location: L knee Pain Descriptors / Indicators: Sore Pain Intervention(s): Monitored during session;Premedicated before session;Limited activity within patient's tolerance     Hand Dominance     Extremity/Trunk Assessment Upper Extremity Assessment Upper Extremity Assessment: Overall WFL for tasks assessed   Lower Extremity Assessment Lower Extremity Assessment: Generalized weakness;LLE deficits/detail LLE Deficits / Details: BLE ankle AROM, strength, and sensation to light touch WNL;  L hip flex strength >/= 3/5 LLE: Unable to fully assess due to pain LLE Sensation: WNL LLE Coordination: WNL       Communication Communication Communication: No difficulties  Cognition Arousal/Alertness: Awake/alert Behavior During Therapy: WFL for tasks assessed/performed Overall Cognitive Status: Within Functional Limits for tasks assessed                                     General Comments       Exercises Total Joint Exercises Ankle Circles/Pumps: AROM;Strengthening;Both;10 reps Quad Sets: AROM;Strengthening;Left;5 reps;10 reps Straight Leg Raises: AROM;Strengthening;Left;5 reps Long Arc Quad: AROM;Strengthening;Left;10  reps;15 reps Knee Flexion: AROM;Strengthening;Left;10 reps;15 reps Goniometric ROM: L knee AROM: 0-93 deg Marching in Standing: AROM;Strengthening;Both;5 reps;Standing Other Exercises Other Exercises: Positioning education to promote L knee ext PROM Other Exercises: HEP education per handout Other Exercises: Multi-modal cues during education for step-to sequencing during gait training Other Exercises: Educ re: AE for LB dressing/bathing, polar care mgmt, TED hose mgmt, home/activity modifications   Shoulder Instructions      Home Living Family/patient expects to be discharged to:: Private residence Living Arrangements: Spouse/significant other Available Help at Discharge: Family;Available 24 hours/day Type of Home: House Home Access: Stairs to enter [employer] of Steps: 3 Entrance Stairs-Rails: Right;Left Home Layout: One level     Bathroom Shower/Tub: Producer, television/film/video: Handicapped height         Additional Comments: Owns a walking stick      Prior Functioning/Environment Level of Independence: Independent        Comments: Ind amb community distances without an AD, uses a walking stick for extreme distances or when walking in the woods, active including yard work, one fall in the last 6 months secondary to slipping on a wet ramp, Ind with ADLs. Does not drive, 2/2 visual impairment.        OT Problem List: Impaired balance (sitting and/or standing);Decreased activity tolerance;Decreased range of motion;Decreased strength      OT Treatment/Interventions:      OT Goals(Current goals can be found in the care plan section) Acute Rehab OT Goals Patient Stated Goal: to not have knee pain OT Goal Formulation: With patient Time For Goal Achievement: 08/10/20 Potential to Achieve Goals: Good  OT Frequency:     Barriers to D/C:            Co-evaluation              AM-PAC OT "6 Clicks" Daily Activity     Outcome Measure Help from  another person eating meals?: None Help from another person taking care of personal grooming?: None Help from another person toileting, which includes using toliet, bedpan, or urinal?: A Little Help from another person bathing (including washing, rinsing, drying)?: A Little Help from another person to put on and taking off regular upper body clothing?: None Help from another person to put on and taking off regular lower body clothing?: A Lot 6 Click Score: 20   End of Session Equipment Utilized During Treatment: Rolling walker  Activity Tolerance: Patient tolerated treatment well Patient left: in chair;with call bell/phone within reach;with nursing/sitter in room;with family/visitor present  OT Visit Diagnosis: Unsteadiness on feet (R26.81);Muscle weakness (generalized) (M62.81)                Time: 2130-8657 OT Time Calculation (min): 27 min Charges:  OT General Charges $OT Visit: 1 Visit OT Evaluation $OT Eval Low Complexity: 1 Low OT Treatments $Self Care/Home Management : 23-37 mins  Latina Craver, PhD, MS, OTR/L 07/27/20, 4:47 PM

## 2020-07-27 NOTE — Anesthesia Procedure Notes (Signed)
Spinal  Start time: 07/27/2020 7:26 AM End time: 07/27/2020 7:32 AM Reason for block: surgical anesthesia Staffing Performed: resident/CRNA  Anesthesiologist: Naomie Dean, MD Resident/CRNA: Manning Charity, CRNA Preanesthetic Checklist Completed: patient identified, IV checked, site marked, risks and benefits discussed, surgical consent, monitors and equipment checked, pre-op evaluation and timeout performed Spinal Block Patient position: sitting Prep: ChloraPrep Patient monitoring: heart rate, continuous pulse ox and blood pressure Approach: midline Location: L3-4 Injection technique: single-shot Needle Needle type: Pencan  Needle gauge: 24 G Assessment Sensory level: T10 Events: CSF return Additional Notes Patient tolerated procedure well. Pencan spinal needle tray  Lot# [id number] [phone]

## 2020-07-27 NOTE — Anesthesia Preprocedure Evaluation (Addendum)
Anesthesia Evaluation  Patient identified by MRN, date of birth, ID band Patient awake    Reviewed: Allergy & Precautions, NPO status , Patient's Chart, lab work & pertinent test results, Unable to perform ROS - Chart review only  History of Anesthesia Complications (+) DIFFICULT AIRWAY, Family history of anesthesia reaction and history of anesthetic complications (Pt unaware of difficult airway issue, mother with PONV)  Airway Mallampati: III       Dental   Pulmonary sleep apnea and Continuous Positive Airway Pressure Ventilation , neg COPD, Not current smoker,           Cardiovascular hypertension, Pt. on medications (-) Past MI and (-) CHF (-) dysrhythmias (-) Valvular Problems/Murmurs     Neuro/Psych neg Seizures    GI/Hepatic Neg liver ROS, neg GERD  ,  Endo/Other  diabetes, Type 2, Oral Hypoglycemic Agents  Renal/GU Renal disease (stones)     Musculoskeletal   Abdominal   Peds  Hematology   Anesthesia Other Findings   Reproductive/Obstetrics                            Anesthesia Physical Anesthesia Plan  ASA: III  Anesthesia Plan: Spinal   Post-op Pain Management:    Induction:   PONV Risk Score and Plan:   Airway Management Planned:   Additional Equipment:   Intra-op Plan:   Post-operative Plan:   Informed Consent: I have reviewed the patients History and Physical, chart, labs and discussed the procedure including the risks, benefits and alternatives for the proposed anesthesia with the patient or authorized representative who has indicated his/her understanding and acceptance.       Plan Discussed with:   Anesthesia Plan Comments:         Anesthesia Quick Evaluation

## 2020-07-28 DIAGNOSIS — M1712 Unilateral primary osteoarthritis, left knee: Secondary | ICD-10-CM | POA: Diagnosis not present

## 2020-07-28 LAB — BPAM RBC
Blood Product Expiration Date: 202205042359
Blood Product Expiration Date: 202205042359
Blood Product Expiration Date: 202205122359
Blood Product Expiration Date: 202205122359
Unit Type and Rh: 6200
Unit Type and Rh: 6200
Unit Type and Rh: 6200
Unit Type and Rh: 6200

## 2020-07-28 LAB — TYPE AND SCREEN
ABO/RH(D): A POS
Antibody Screen: POSITIVE
PT AG Type: NEGATIVE
Unit division: 0
Unit division: 0
Unit division: 0
Unit division: 0

## 2020-07-28 LAB — GLUCOSE, CAPILLARY
Glucose-Capillary: 159 mg/dL — ABNORMAL HIGH (ref 70–99)
Glucose-Capillary: 163 mg/dL — ABNORMAL HIGH (ref 70–99)

## 2020-07-28 MED ORDER — ENOXAPARIN SODIUM 40 MG/0.4ML ~~LOC~~ SOLN: 40.0000 mg | SUBCUTANEOUS | 0 refills | Status: DC

## 2020-07-28 MED ORDER — CELECOXIB 200 MG PO CAPS: 200.0000 mg | ORAL_CAPSULE | Freq: Two times a day (BID) | ORAL | 0 refills | Status: DC

## 2020-07-28 MED ORDER — OXYCODONE HCL 5 MG PO TABS: 5.0000 mg | ORAL_TABLET | ORAL | 0 refills | Status: DC | PRN

## 2020-07-28 NOTE — Discharge Summary (Signed)
Physician Discharge Summary  Patient ID: Mitchell Case MRN: [id number] DOB/AGE: [birth year] 65 y.o.  Admit date: 07/27/2020 Discharge date: 07/28/2020  Admission Diagnoses:  Total knee replacement status [Z96.659]  Surgeries:Procedure(s):  Left total knee arthroplasty using computer-assisted navigation  SURGEON:  Jena Gauss. M.D.  ASSISTANT: Baldwin Jamaica, PA-C (present and scrubbed throughout the case, critical for assistance with exposure, retraction, instrumentation, and closure)  ANESTHESIA: spinal  ESTIMATED BLOOD LOSS: 50 mL  FLUIDS REPLACED: 1200 mL of crystalloid  TOURNIQUET TIME: 111 minutes  DRAINS: 2 medium Hemovac drains  SOFT TISSUE RELEASES: Anterior cruciate ligament, posterior cruciate ligament, deep medial collateral ligament, patellofemoral ligament  IMPLANTS UTILIZED: DePuy Attune size 9 posterior stabilized femoral component (cemented), size 9 rotating platform tibial component (cemented), 41 mm medialized dome patella (cemented), and an 8 mm stabilized rotating platform polyethylene insert.  Discharge Diagnoses: Patient Active Problem List   Diagnosis Date Noted  . Total knee replacement status 07/27/2020  . 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 Kidney stone 07/26/2020  . Sleep apnea 07/26/2020  . Primary osteoarthritis of left knee 06/24/2020  . Type 2 diabetes mellitus without complication, without long-term current use of insulin (HCC) 02/26/2016  . Essential hypertension 07/17/2014  . Pure hypercholesterolemia 07/17/2014  . Iron deficiency anemia due to chronic blood loss 04/10/2014  . Lower GI bleed 01/02/2014  . Difficult intubation 11/11/2012    Past Medical History:  Diagnosis Date  . Arthritis   . Diabetes mellitus without complication (HCC)   . Family history of adverse reaction to anesthesia    PONV in mother  . GI bleed   . 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 History of  kidney stones   . HLD (hyperlipidemia)   . Hypertension   . Macular degeneration   . Sleep apnea      Transfusion:    Consultants (if any):   Discharged Condition: Improved  Hospital Course: Mitchell Case is an 65 y.o. male who was admitted 07/27/2020 with a diagnosis of left knee osteoarthritis and went to the operating room on 07/27/2020 and underwent left total knee arthoplasty. The patient received perioperative antibiotics for prophylaxis (see below). The patient tolerated the procedure well and was transported to PACU in stable condition. After meeting PACU criteria, the patient was subsequently transferred to the Orthopaedics/Rehabilitation unit.   The patient received DVT prophylaxis in the form of early mobilization, Lovenox, Foot Pumps and TED hose. A sacral pad had been placed and heels were elevated off of the bed with rolled towels in order to protect skin integrity. Foley catheter was discontinued on postoperative day #0. Wound drains were discontinued on postoperative day #1. The surgical incision was healing well without signs of infection.  Physical therapy was initiated postoperatively for transfers, gait training, and strengthening. Occupational therapy was initiated for activities of daily living and evaluation for assisted devices. Rehabilitation goals were reviewed in detail with the patient. The patient made steady progress with physical therapy and physical therapy recommended discharge to Home.   The patient achieved the preliminary goals of this hospitalization and was felt to be medically and orthopaedically appropriate for discharge.  He was given perioperative antibiotics:  Anti-infectives (From admission, onward)   Start     Dose/Rate Route Frequency Ordered Stop   07/27/20 1400  ceFAZolin (ANCEF) IVPB 2g/100 mL premix        2 g 200 mL/hr over 30 Minutes Intravenous Every 6 hours 07/27/20 1313  07/27/20 2130   07/27/20 0603  ceFAZolin (ANCEF) 2-4 GM/100ML-% IVPB        Note to Pharmacy: Mitchell Case, Cryst: cabinet override      07/27/20 0603 07/27/20 0746   07/27/20 0600  ceFAZolin (ANCEF) IVPB 2g/100 mL premix        2 g 200 mL/hr over 30 Minutes Intravenous On call to O.R. 07/27/20 0559 07/27/20 0735    .  Recent vital signs:  Vitals:   07/28/20 0527 07/28/20 0816  BP: 103/71 120/85  Pulse: 62 71  Resp: 18 16  Temp: 97.7 F (36.5 C) (!) 97.5 F (36.4 C)  SpO2: 96% 97%    Recent laboratory studies:  No results for input(s): WBC, HGB, HCT, PLT, K, CL, CO2, BUN, CREATININE, GLUCOSE, CALCIUM, LABPT, INR in the last 72 hours.  Diagnostic Studies: NM Myocar Multi W/Spect W/Wall Motion / EF  Result Date: 07/24/2020  Low risk, probably normal pharmacologic myocardial perfusion stress test  There is a small in size, mild in severity, apical inferior and apical lateral defect most likely representing artifact (attenuation) but cannot exclude small region of scar.  There is a small in size, mild in severity, apical anterior and mid anteroseptal defect most consistent with RV insertion.  No significant ischemia is observed.  The left ventricular ejection fraction is normal (54% by QGS, 59% by Siemens).  No significant coronary artery calcification is observed on the attenuation correction CT. Mild aortic atherosclerosis and aortic valvular calcification are incidentally noted.    DG Knee Left Port  Result Date: 07/27/2020 CLINICAL DATA:  Post left knee arthroplasty. EXAM: PORTABLE LEFT KNEE - 1-2 VIEW COMPARISON:  None. FINDINGS: Examination demonstrates expected changes post left total knee arthroplasty with prosthetic components intact and normally located. Surgical drain is present. IMPRESSION: Expected changes post left total knee arthroplasty. Electronically Signed   By: Elberta Fortisaniel  Boyle M.D.   On: 07/27/2020 12:17   ECHOCARDIOGRAM COMPLETE  Result Date: 07/17/2020    ECHOCARDIOGRAM REPORT   Patient Name:   Mitchell Case Date of Exam: 07/16/2020  Medical Rec #:  [id number]    Height:       76.0 in Accession #:    [id number]   Weight:       269.0 lb Date of Birth:  June 02, 1955   BSA:          2.513 m Patient Age:    65 years     BP:           122/80 mmHg Patient Gender: M            HR:           87 bpm. Exam Location:  [facility] Procedure: 2D Echo, Cardiac Doppler, Color Doppler and Intracardiac            Opacification Agent Indications:    R94.31 Abnormal EKG; R06.9 DOE  History:        Patient has no prior history of Echocardiogram examinations.                 Abnormal ECG, Signs/Symptoms:Dyspnea; Risk Factors:Hypertension,                 Diabetes, Dyslipidemia, Non-Smoker, Sleep Apnea and Family                 History of Coronary Artery Disease. Patient has some DOE                 intermittently. No  chest pain, SOB or leg edema.  Sonographer:    Carlos American RVT, RDCS (AE), RDMS Referring Phys: [id number] [facility]  Sonographer Comments: Suboptimal parasternal window, suboptimal subcostal window and patient is morbidly obese. Image acquisition challenging due to patient body habitus. IMPRESSIONS  1. Left ventricular ejection fraction, by estimation, is 55 to 60%. The left ventricle has normal function. The left ventricle has no regional wall motion abnormalities. There is mild left ventricular hypertrophy. Left ventricular diastolic parameters are consistent with Grade I diastolic dysfunction (impaired relaxation).  2. Right ventricular systolic function is normal. The right ventricular size is normal. Tricuspid regurgitation signal is inadequate for assessing PA pressure.  3. Left atrial size was mildly dilated.  4. The mitral valve is normal in structure. No evidence of mitral valve regurgitation. No evidence of mitral stenosis.  5. The aortic valve has an indeterminant number of cusps. Aortic valve regurgitation is not visualized. Mild to moderate aortic valve sclerosis/calcification is present, without any evidence of aortic stenosis.  FINDINGS  Left Ventricle: Left ventricular ejection fraction, by estimation, is 55 to 60%. The left ventricle has normal function. The left ventricle has no regional wall motion abnormalities. Definity contrast agent was given IV to delineate the left ventricular  endocardial borders. Global longitudinal strain performed but not reported based on interpreter judgement due to suboptimal tracking. The left ventricular internal cavity size was normal in size. There is mild left ventricular hypertrophy. Left ventricular diastolic parameters are consistent with Grade I diastolic dysfunction (impaired relaxation). Right Ventricle: The right ventricular size is normal. No increase in right ventricular wall thickness. Right ventricular systolic function is normal. Tricuspid regurgitation signal is inadequate for assessing PA pressure. Left Atrium: Left atrial size was mildly dilated. Right Atrium: Right atrial size was normal in size. Pericardium: There is no evidence of pericardial effusion. Mitral Valve: The mitral valve is normal in structure. No evidence of mitral valve regurgitation. No evidence of mitral valve stenosis. Tricuspid Valve: The tricuspid valve is normal in structure. Tricuspid valve regurgitation is not demonstrated. No evidence of tricuspid stenosis. Aortic Valve: The aortic valve has an indeterminant number of cusps. Aortic valve regurgitation is not visualized. Mild to moderate aortic valve sclerosis/calcification is present, without any evidence of aortic stenosis. Aortic valve mean gradient measures 4.0 mmHg. Aortic valve peak gradient measures 8.2 mmHg. Aortic valve area, by VTI measures 2.93 cm. Pulmonic Valve: The pulmonic valve was normal in structure. Pulmonic valve regurgitation is not visualized. No evidence of pulmonic stenosis. Aorta: The aortic root is normal in size and structure. Venous: The inferior vena cava was not well visualized. IAS/Shunts: No atrial level shunt detected by color  flow Doppler.  LEFT VENTRICLE PLAX 2D LVIDd:         4.50 cm     Diastology LVIDs:         2.90 cm     LV e' medial:    6.09 cm/s LV PW:         1.20 cm     LV E/e' medial:  9.0 LV IVS:        0.80 cm     LV e' lateral:   10.30 cm/s LVOT diam:     2.10 cm     LV E/e' lateral: 5.3 LV SV:         64 LV SV Index:   26 LVOT Area:     3.46 cm  3D Volume EF: LV Volumes (MOD)           3D EF:        67 % LV vol d, MOD A2C: 81.4 ml LV EDV:       167 ml LV vol d, MOD A4C: 97.8 ml LV ESV:       54 ml LV vol s, MOD A2C: 39.4 ml LV SV:        112 ml LV vol s, MOD A4C: 40.9 ml LV SV MOD A2C:     42.0 ml LV SV MOD A4C:     97.8 ml LV SV MOD BP:      48.9 ml RIGHT VENTRICLE RV S prime:     14.15 cm/s TAPSE (M-mode): 2.8 cm LEFT ATRIUM             Index       RIGHT ATRIUM           Index LA diam:        4.00 cm 1.59 cm/m  RA Area:     15.30 cm LA Vol (A2C):   56.6 ml 22.53 ml/m RA Volume:   40.80 ml  16.24 ml/m LA Vol (A4C):   79.6 ml 31.68 ml/m LA Biplane Vol: 69.3 ml 27.58 ml/m  AORTIC VALVE AV Area (Vmax):    2.45 cm AV Area (Vmean):   2.71 cm AV Area (VTI):     2.93 cm AV Vmax:           143.00 cm/s AV Vmean:          96.200 cm/s AV VTI:            0.219 m AV Peak Grad:      8.2 mmHg AV Mean Grad:      4.0 mmHg LVOT Vmax:         101.00 cm/s LVOT Vmean:        75.300 cm/s LVOT VTI:          0.185 m LVOT/AV VTI ratio: 0.84  AORTA Ao Root diam: 3.55 cm Ao Arch diam: 3.3 cm MITRAL VALVE               TRICUSPID VALVE MV Area (PHT): 3.42 cm    TR Peak grad:   4.8 mmHg MV Decel Time: 222 msec    TR Vmax:        110.00 cm/s MV E velocity: 54.80 cm/s MV A velocity: 76.40 cm/s  SHUNTS MV E/A ratio:  0.72        Systemic VTI:  0.18 m                            Systemic Diam: 2.10 cm Lorine Bears MD Electronically signed by Lorine Bears MD Signature Date/Time: 07/17/2020/1:35:45 PM    Final     Discharge Medications:   Allergies as of 07/28/2020      Reactions   Penicillins Swelling   IgE = 29  (WNL) on 07/13/2020 Has patient had a PCN reaction causing immediate rash, facial/tongue/throat swelling, SOB or lightheadedness with hypotension: No Has patient had a PCN reaction causing severe rash involving mucus membranes or skin necrosis: No Has patient had a PCN reaction that required hospitalization No Has patient had a PCN reaction occurring within the last 10 years: No If all of the above answers are "NO", then may proceed with Cephalosporin use.   Sulfa Antibiotics Itching  Atorvastatin    Muscle Pain   Hydrochlorothiazide Itching      Medication List    STOP taking these medications   ibuprofen 200 MG tablet Commonly known as: ADVIL     TAKE these medications   celecoxib 200 MG capsule Commonly known as: CELEBREX Take 1 capsule (200 mg total) by mouth 2 (two) times daily.   enoxaparin 40 MG/0.4ML injection Commonly known as: LOVENOX Inject 0.4 mLs (40 mg total) into the skin daily for 14 days.   fluticasone 50 MCG/ACT nasal spray Commonly known as: FLONASE Place 1 spray into both nostrils daily as needed for allergies or rhinitis.   liraglutide 18 MG/3ML Sopn Commonly known as: VICTOZA Inject 0.6 mg into the skin daily.   lisinopril 10 MG tablet Commonly known as: ZESTRIL Take 10 mg by mouth at bedtime.   metFORMIN 1000 MG tablet Commonly known as: GLUCOPHAGE Take 1,000 mg by mouth 2 (two) times daily with a meal.   OCUVITE PO Take 1 tablet by mouth daily.   oxyCODONE 5 MG immediate release tablet Commonly known as: Oxy IR/ROXICODONE Take 1 tablet (5 mg total) by mouth every 4 (four) hours as needed for moderate pain (pain score 4-6).   rosuvastatin 5 MG tablet Commonly known as: CRESTOR Take 5 mg by mouth every Monday. At bedtime   tamsulosin 0.4 MG Caps capsule Commonly known as: FLOMAX Take 0.4 mg by mouth at bedtime.            Durable Medical Equipment  (From admission, onward)         Start     Ordered   07/27/20 1313  DME Walker  rolling  Once       Question:  Patient needs a walker to treat with the following condition  Answer:  Total knee replacement status   07/27/20 1313   07/27/20 1313  DME Bedside commode  Once       Question:  Patient needs a bedside commode to treat with the following condition  Answer:  Total knee replacement status   07/27/20 1313          Disposition: Home with home health PT     Follow-up Information    [facility] On 08/09/2020.   Specialty: Orthopedic Surgery Why: at 8:45am Contact information: [address] [facility] [place] [address] [phone]        Donato Heinz, MD On 09/06/2020.   Specialty: Orthopedic Surgery Why: at 11:30am Contact information: [address] [facility] [address] [facility] [place] [address] [phone]                [name], PA-C 07/28/2020, 11:56 AM

## 2020-07-28 NOTE — Progress Notes (Signed)
  Subjective: 1 Day Post-Op Procedure(s) (LRB): COMPUTER ASSISTED TOTAL KNEE ARTHROPLASTY (Left) Patient reports pain as well-controlled.   Patient is well, and has had no acute complaints or problems Plan is to go Home after hospital stay. Negative for chest pain and shortness of breath Fever: no Gastrointestinal: negative for nausea and vomiting.   Patient has had a bowel movement.  Objective: Vital signs in last 24 hours: Temp:  [97 F (36.1 C)-98.7 F (37.1 C)] 97.5 F (36.4 C) (04/16 0816) Pulse Rate:  [62-86] 71 (04/16 0816) Resp:  [15-20] 16 (04/16 0816) BP: (103-125)/(71-89) 120/85 (04/16 0816) SpO2:  [93 %-99 %] 97 % (04/16 0816)  Intake/Output from previous day:  Intake/Output Summary (Last 24 hours) at 07/28/2020 1141 Last data filed at 07/28/2020 1000 Gross per 24 hour  Intake 681.67 ml  Output 1085 ml  Net -403.33 ml    Intake/Output this shift: Total I/O In: 120 [P.O.:120] Out: -   Labs: No results for input(s): HGB in the last 72 hours. No results for input(s): WBC, RBC, HCT, PLT in the last 72 hours. No results for input(s): NA, K, CL, CO2, BUN, CREATININE, GLUCOSE, CALCIUM in the last 72 hours. No results for input(s): LABPT, INR in the last 72 hours.   EXAM General - Patient is Alert, Appropriate and Oriented Extremity - Neurovascular intact Dorsiflexion/Plantar flexion intact Compartment soft Dressing/Incision -Postoperative dressing remains in place., Polar Care in place and working. , Hemovac in place.  Motor Function - intact, moving foot and toes well on exam.  Cardiovascular- Regular rate and rhythm, no murmurs/rubs/gallops Respiratory- Lungs clear to auscultation bilaterally Gastrointestinal- soft, nontender and active bowel sounds   Assessment/Plan: 1 Day Post-Op Procedure(s) (LRB): COMPUTER ASSISTED TOTAL KNEE ARTHROPLASTY (Left) Active Problems:   Total knee replacement status  Estimated body mass index is 32.74 kg/m as  calculated from the following:   Height as of this encounter: 6\' 4"  (1.93 m).   Weight as of this encounter: 122 kg. Advance diet Up with therapy Discharge home with home health pending completion of PT goals and BM. Anticipate d/c home today.  Post op dressing removed. Hemovac removed. Mini compression dressing applied.   DVT Prophylaxis - Lovenox, Ted hose and foot pumps Weight-Bearing as tolerated to left leg  , PA-C [facility] Orthopaedic Surgery 07/28/2020, 11:41 AM

## 2020-07-28 NOTE — Plan of Care (Signed)
  Problem: Education: Goal: Knowledge of General Education information will improve Description: Including pain rating scale, medication(s)/side effects and non-pharmacologic comfort measures Outcome: Progressing   Problem: Activity: Goal: Risk for activity intolerance will decrease Outcome: Progressing   Problem: Coping: Goal: Level of anxiety will decrease Outcome: Progressing   Problem: Elimination: Goal: Will not experience complications related to bowel motility Outcome: Progressing   Problem: Pain Managment: Goal: General experience of comfort will improve Outcome: Progressing   Problem: Safety: Goal: Ability to remain free from injury will improve Outcome: Progressing   Problem: Skin Integrity: Goal: Risk for impaired skin integrity will decrease Outcome: Progressing   Problem: Education: Goal: Knowledge of the prescribed therapeutic regimen will improve Outcome: Progressing   Problem: Pain Management: Goal: Pain level will decrease with appropriate interventions Outcome: Progressing   Problem: Skin Integrity: Goal: Will show signs of wound healing Outcome: Progressing

## 2020-07-28 NOTE — Progress Notes (Signed)
Physical Therapy Treatment Patient Details Name: Mitchell Case MRN: [id number] DOB: 1956-02-07 Today's Date: 07/28/2020    History of Present Illness Pt is a 65 yo male diagnosed with degenerative arthrosis of the left knee and is s/p elective L TKA. PMH includes: HTN and DM.    PT Comments    Pt resting in bed upon PT arrival; agreeable to PT session.  2-3/10 L knee pain beginning/end of session at rest and 4-5/10 with activity.  L knee AROM 0-90 degrees.  Did well tolerating LE ex's in bed.  Independent with bed mobility; SBA with transfers using RW; and CGA ambulating 180 feet with RW.  No L knee buckling noted during sessions activities and pt able to perform L LE SLR independently (no KI utilized during session).  Will trial stairs and progress ambulation distance this afternoon (pt reporting hoping to discharge home today).    Follow Up Recommendations  Home health PT     Equipment Recommendations  Rolling walker with 5" wheels;3in1 (PT)    Recommendations for Other Services       Precautions / Restrictions Precautions Precautions: Fall Required Braces or Orthoses: Knee Immobilizer - Left Knee Immobilizer - Left: Discontinue once straight leg raise with < 10 degree lag Restrictions Weight Bearing Restrictions: Yes LLE Weight Bearing: Weight bearing as tolerated Other Position/Activity Restrictions: Pt able to perform L LE SLR independently (no KI required)    Mobility  Bed Mobility Overal bed mobility: Independent             General bed mobility comments: Supine to/from sitting without any noted difficulty; bed flat    Transfers Overall transfer level: Needs assistance Equipment used: Rolling walker (2 wheeled) Transfers: Sit to/from Stand Sit to Stand: Supervision         General transfer comment: occasional vc's for technique; mild increased effort to stand from bed (low for pt d/t pt being tall); steady  Ambulation/Gait Ambulation/Gait assistance:  Min guard Gait Distance (Feet): 180 Feet Assistive device: Rolling walker (2 wheeled) Gait Pattern/deviations: Step-to pattern Gait velocity: decreased   General Gait Details: initial vc's for stepping technique, walker use, and positioning within RW; pt steady; no knee buckling noted   Stairs             Wheelchair Mobility    Modified Rankin (Stroke Patients Only)       Balance Overall balance assessment: Needs assistance Sitting-balance support: No upper extremity supported;Feet supported Sitting balance-Leahy Scale: Normal Sitting balance - Comments: steady sitting reaching outside BOS   Standing balance support: No upper extremity supported Standing balance-Leahy Scale: Good Standing balance comment: steady standing urinating in bathroom and washing hands at sink                            Cognition Arousal/Alertness: Awake/alert Behavior During Therapy: WFL for tasks assessed/performed Overall Cognitive Status: Within Functional Limits for tasks assessed                                        Exercises Total Joint Exercises Ankle Circles/Pumps: AROM;Strengthening;Both;10 reps;Supine Quad Sets: AROM;Strengthening;Both;10 reps;Supine Short Arc Quad: AROM;Strengthening;Left;10 reps;Supine Heel Slides: AAROM;Strengthening;Left;10 reps;Supine Hip ABduction/ADduction: AROM;Strengthening;Left;10 reps;Supine Straight Leg Raises: AROM;Strengthening;Left;10 reps;Supine Goniometric ROM: L knee AROM 0-90 degrees    General Comments General comments (skin integrity, edema, etc.): L LE hemovac in place.  Nursing cleared pt for participation in physical therapy.  Pt agreeable to PT session.  Pt's wife present during session.      Pertinent Vitals/Pain Pain Assessment: 0-10 Pain Score: 3  Pain Location: L knee Pain Descriptors / Indicators: Sore Pain Intervention(s): Limited activity within patient's tolerance;Monitored during  session;Premedicated before session;Repositioned;Other (comment) (polar care applied and activated)  Vitals (HR and O2 on room air) stable and WFL throughout treatment session.    Home Living                      Prior Function            PT Goals (current goals can now be found in the care plan section) Acute Rehab PT Goals Patient Stated Goal: to not have knee pain PT Goal Formulation: With patient Time For Goal Achievement: 08/09/20 Potential to Achieve Goals: Good Progress towards PT goals: Progressing toward goals    Frequency    BID      PT Plan Current plan remains appropriate    Co-evaluation              AM-PAC PT "6 Clicks" Mobility   Outcome Measure  Help needed turning from your back to your side while in a flat bed without using bedrails?: None Help needed moving from lying on your back to sitting on the side of a flat bed without using bedrails?: None Help needed moving to and from a bed to a chair (including a wheelchair)?: A Little Help needed standing up from a chair using your arms (e.g., wheelchair or bedside chair)?: A Little Help needed to walk in hospital room?: A Little Help needed climbing 3-5 steps with a railing? : A Little 6 Click Score: 20    End of Session Equipment Utilized During Treatment: Gait belt Activity Tolerance: Patient tolerated treatment well Patient left: in chair;with call bell/phone within reach;with chair alarm set;with family/visitor present;with SCD's reapplied;Other (comment) (pt's LE's too long on legrest of recliner to place towel rolls (pt educated on L quad sets in recliner and L knee movement to maintain L extension ROM--pt verbalizing and demonstrating understanding)) Nurse Communication: Mobility status;Weight bearing status;Precautions PT Visit Diagnosis: Muscle weakness (generalized) (M62.81);Other abnormalities of gait and mobility (R26.89);Pain Pain - Right/Left: Left Pain - part of body: Knee      Time: 8502-7741 PT Time Calculation (min) (ACUTE ONLY): 49 min  Charges:  $Gait Training: 8-22 mins $Therapeutic Exercise: 8-22 mins $Therapeutic Activity: 8-22 mins                    Luigi Stuckey, PT 07/28/20, 11:01 AM

## 2020-07-28 NOTE — Anesthesia Postprocedure Evaluation (Signed)
Anesthesia Post Note  Patient: [name]  Procedure(s) Performed: COMPUTER ASSISTED TOTAL KNEE ARTHROPLASTY (Left Knee)  Patient location during evaluation: Nursing Unit Anesthesia Type: Spinal Level of consciousness: oriented and awake and alert Pain management: pain level controlled Vital Signs Assessment: post-procedure vital signs reviewed and stable Respiratory status: spontaneous breathing, respiratory function stable and patient connected to nasal cannula oxygen Cardiovascular status: blood pressure returned to baseline and stable Postop Assessment: no headache, no backache, no apparent nausea or vomiting and able to ambulate Anesthetic complications: no Comments: Patient did well with PT yesterday and today.  Has been urinating without difficulty.  Hoping to go home today.   No complications documented.   Last Vitals:  Vitals:   07/28/20 0527 07/28/20 0816  BP: 103/71 120/85  Pulse: 62 71  Resp: 18 16  Temp: 36.5 C (!) 36.4 C  SpO2: 96% 97%    Last Pain:  Vitals:   07/28/20 0856  TempSrc:   PainSc: 2                  Lenard Simmer

## 2020-07-28 NOTE — Progress Notes (Signed)
Physical Therapy Treatment Patient Details Name: Mitchell Case MRN: [id number] DOB: 03/16/1956 Today's Date: 07/28/2020    History of Present Illness Pt is a 65 yo male diagnosed with degenerative arthrosis of the left knee and is s/p elective L TKA. PMH includes: HTN and DM.    PT Comments    Pt resting in recliner upon PT arrival and already changed into personal clothes (for DC home today); pt's wife present.  Modified independent with transfers using RW; SBA with ambulation 180 feet x2 with RW; and SBA navigating 4 steps with railing.  Pt steady and safe with all functional mobility during sessions activities.  Pain 3/10 L knee end of session at rest.  Reviewed safe car transfer technique and pt and pt's wife appearing with good understanding.  Pt and pt's wife report no questions or concerns for discharge home today.  Pt appears safe to discharge home with support of wife.  Plan to discharge home today with HHPT.   Follow Up Recommendations  Home health PT     Equipment Recommendations  Rolling walker with 5" wheels;3in1 (PT)    Recommendations for Other Services       Precautions / Restrictions Precautions Precautions: Fall Required Braces or Orthoses: Knee Immobilizer - Left Knee Immobilizer - Left: Discontinue once straight leg raise with < 10 degree lag Restrictions Weight Bearing Restrictions: Yes LLE Weight Bearing: Weight bearing as tolerated Other Position/Activity Restrictions: Pt able to perform L LE SLR independently (no KI required)    Mobility  Bed Mobility             General bed mobility comments: Deferred (pt independent with bed mobility this morning)    Transfers Overall transfer level: Modified independent Equipment used: Rolling walker (2 wheeled) Transfers: Sit to/from Stand Sit to Stand: Modified independent (Device/Increase time)         General transfer comment: steady safe transfer noted from recliner with RW  use  Ambulation/Gait Ambulation/Gait assistance: Supervision Gait Distance (Feet):  (180 feet x2) Assistive device: Rolling walker (2 wheeled) Gait Pattern/deviations: Step-to pattern;Step-through pattern Gait velocity: decreased   General Gait Details: step to gait pattern mostly except step through gait pattern last 50 feet of 2nd trial ambulation; steady with RW; no knee buckling noted   Stairs Stairs: Yes Stairs assistance: Supervision Stair Management: One rail Left;Step to pattern;Sideways Number of Stairs: 4 General stair comments: initial vc's for technique and LE sequencing and then pt able to perform safely on own (wife present for stair training and also verbalizing understanding)   Wheelchair Mobility    Modified Rankin (Stroke Patients Only)       Balance Overall balance assessment: Needs assistance Sitting-balance support: No upper extremity supported;Feet supported Sitting balance-Leahy Scale: Normal Sitting balance - Comments: steady sitting reaching outside BOS   Standing balance support: No upper extremity supported Standing balance-Leahy Scale: Good Standing balance comment: steady standing reaching within BOS                            Cognition Arousal/Alertness: Awake/alert Behavior During Therapy: WFL for tasks assessed/performed Overall Cognitive Status: Within Functional Limits for tasks assessed                                        Exercises Total Joint Exercises Long Arc Quad: AROM;Strengthening;Left;10 reps;Seated Knee Flexion: AROM;Strengthening;Left;10 reps;Seated  Goniometric ROM: L knee AROM 0-90 degrees (taken AM session)    General Comments General comments (skin integrity, edema, etc.): minimal drainage noted L knee honeycomb dressing      Pertinent Vitals/Pain Pain Assessment: 0-10 Pain Score: 3  Pain Location: L knee Pain Descriptors / Indicators: Sore Pain Intervention(s): Limited activity  within patient's tolerance;Monitored during session;Premedicated before session;Repositioned;Other (comment) (polar care applied and activated)  Vitals (HR and O2 on room air) stable and WFL throughout treatment session.    Home Living                      Prior Function            PT Goals (current goals can now be found in the care plan section) Acute Rehab PT Goals Patient Stated Goal: to not have knee pain PT Goal Formulation: With patient Time For Goal Achievement: 08/09/20 Potential to Achieve Goals: Good Progress towards PT goals: Progressing toward goals    Frequency    BID      PT Plan Current plan remains appropriate    Co-evaluation              AM-PAC PT "6 Clicks" Mobility   Outcome Measure  Help needed turning from your back to your side while in a flat bed without using bedrails?: None Help needed moving from lying on your back to sitting on the side of a flat bed without using bedrails?: None Help needed moving to and from a bed to a chair (including a wheelchair)?: None Help needed standing up from a chair using your arms (e.g., wheelchair or bedside chair)?: None Help needed to walk in hospital room?: A Little Help needed climbing 3-5 steps with a railing? : A Little 6 Click Score: 22    End of Session Equipment Utilized During Treatment: Gait belt Activity Tolerance: Patient tolerated treatment well Patient left: in chair;with call bell/phone within reach;with chair alarm set;with family/visitor present;Other (comment) (polar care on; nurse preparing pt for discharge home) Nurse Communication: Mobility status;Weight bearing status;Precautions PT Visit Diagnosis: Muscle weakness (generalized) (M62.81);Other abnormalities of gait and mobility (R26.89);Pain Pain - Right/Left: Left Pain - part of body: Knee     Time: 1335-1413 PT Time Calculation (min) (ACUTE ONLY): 38 min  Charges:  $Gait Training: 23-37 mins $Therapeutic Exercise:  8-22 mins                   Hendricks Limes, PT 07/28/20, 2:39 PM

## 2020-07-28 NOTE — Plan of Care (Signed)

## 2020-07-30 ENCOUNTER — Encounter: Payer: Self-pay | Admitting: Orthopedic Surgery

## 2020-08-13 ENCOUNTER — Other Ambulatory Visit: Payer: Self-pay

## 2020-08-13 ENCOUNTER — Ambulatory Visit
Admission: RE | Admit: 2020-08-13 | Discharge: 2020-08-13 | Disposition: A | Discharge status: Home / Self Care | Payer: Medicare HMO | Source: Ambulatory Visit | Attending: Family Medicine | Admitting: Family Medicine

## 2020-08-13 DIAGNOSIS — R748 Abnormal levels of other serum enzymes: Secondary | ICD-10-CM | POA: Diagnosis present

## 2020-08-17 ENCOUNTER — Ambulatory Visit: Payer: Medicare HMO | Admitting: Cardiology

## 2020-08-17 ENCOUNTER — Other Ambulatory Visit: Payer: Self-pay

## 2020-08-17 ENCOUNTER — Encounter: Payer: Self-pay | Admitting: Cardiology

## 2020-08-17 VITALS — BP 126/80 | HR 80 | Ht 76.0 in | Wt 264.6 lb

## 2020-08-17 DIAGNOSIS — Z01818 Encounter for other preprocedural examination: Secondary | ICD-10-CM

## 2020-08-17 DIAGNOSIS — E7800 Pure hypercholesterolemia, unspecified: Secondary | ICD-10-CM

## 2020-08-17 DIAGNOSIS — I10 Essential (primary) hypertension: Secondary | ICD-10-CM | POA: Diagnosis not present

## 2020-08-17 NOTE — Patient Instructions (Signed)

## 2020-08-17 NOTE — Progress Notes (Signed)
Cardiology Office Note:    Date:  08/17/2020   ID:  Mitchell Case, DOB 1955-05-02, MRN [id number]  PCP:  Marina Goodell, MD    [facility]  Cardiologist:  Debbe Odea, MD  Advanced Practice Provider:  No care team member to display Electrophysiologist:  None       Referring MD: Marina Goodell, MD   No chief complaint on file.   History of Present Illness:    Mitchell Case is a 65 y.o. male with a hx of hypertension, diabetes, hyperlipidemia who presents for follow-up.  Previously seen for preop evaluation prior to knee surgery as a result of osteoarthritis.  Had his left knee done not too long ago, recovering successfully.  Right knee arthroplasty is being planned.  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  Had an episode of diaphoresis while walking around the grocery store, family history of CAD, risk factors.  Echo and Lexiscan Myoview was ordered to evaluate any significant cardiac pathology.  He now presents for results.  Feels okay, denies chest pain or shortness of breath.  Prior notes  family history of CAD with mother having bypass in her 54s, that has stents in his 40s.   Past Medical History:  Diagnosis Date  . Arthritis   . Diabetes mellitus without complication (HCC)   . Family history of adverse reaction to anesthesia    PONV in mother  . GI bleed   . 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 History of kidney stones   . HLD (hyperlipidemia)   . Hypertension   . Macular degeneration   . Sleep apnea     Past Surgical History:  Procedure Laterality Date  . ACHILLES TENDON REPAIR    . COLONOSCOPY    . KIDNEY STONE SURGERY Bilateral   . KNEE ARTHROPLASTY Left 07/27/2020   Procedure: COMPUTER ASSISTED TOTAL KNEE ARTHROPLASTY;  Surgeon: Donato Heinz, MD;  Location: [facility] ORS;  Service: Orthopedics;  Laterality: Left;    Current Medications: Current Meds  Medication Sig  . celecoxib (CELEBREX)  200 MG capsule Take 1 capsule (200 mg total) by mouth 2 (two) times daily.  . fluticasone (FLONASE) 50 MCG/ACT nasal spray Place 1 spray into both nostrils daily as needed for allergies or rhinitis.  Marland Kitchen liraglutide (VICTOZA) 18 MG/3ML SOPN Inject 0.6 mg into the skin daily.  Marland Kitchen lisinopril (PRINIVIL,ZESTRIL) 10 MG tablet Take 10 mg by mouth at bedtime.  . metFORMIN (GLUCOPHAGE) 1000 MG tablet Take 1,000 mg by mouth 2 (two) times daily with a meal.  . Multiple Vitamins-Minerals (OCUVITE PO) Take 1 tablet by mouth daily.  Marland Kitchen oxyCODONE (OXY IR/ROXICODONE) 5 MG immediate release tablet Take 1 tablet (5 mg total) by mouth every 4 (four) hours as needed for moderate pain (pain score 4-6).  . rosuvastatin (CRESTOR) 5 MG tablet Take 5 mg by mouth every Monday. At bedtime  . tamsulosin (FLOMAX) 0.4 MG CAPS capsule Take 0.4 mg by mouth at bedtime.     Allergies:   Penicillins, Sulfa antibiotics, Atorvastatin, and Hydrochlorothiazide   Social History   Socioeconomic History  . Marital status: Married    Spouse name: Not on file  . Number of children: Not on file  . Years of education: Not on file  . Highest education level: Not on file  Occupational History  . Not on file  Tobacco Use  . Smoking status: Never Smoker  . Smokeless tobacco: Never Used  Vaping Use  . Vaping Use: Never used  Substance and Sexual Activity  . Alcohol use: No  . Drug use: No  . Sexual activity: Not on file  Other Topics Concern  . Not on file  Social History Narrative  . Not on file   Social Determinants of Health   Financial Resource Strain: Not on file  Food Insecurity: Not on file  Transportation Needs: Not on file  Physical Activity: Not on file  Stress: Not on file  Social Connections: Not on file     Family History: The patient's family history is not on file.  ROS:   Please see the history of present illness.     All other systems reviewed and are negative.  EKGs/Labs/Other Studies Reviewed:     The following studies were reviewed today:   EKG:  EKG is  ordered today.  The ekg ordered today demonstrates normal sinus rhythm, possible old inferior infarct  Recent Labs: 07/13/2020: ALT 108; BUN 16; Creatinine, Ser 1.12; Hemoglobin 14.3; Platelets 263; Potassium 4.0; Sodium 139  Recent Lipid Panel No results found for: CHOL, TRIG, HDL, CHOLHDL, VLDL, LDLCALC, LDLDIRECT   Risk Assessment/Calculations:      Physical Exam:    VS:  BP 126/80   Pulse 80   Ht 6\' 4"  (1.93 m)   Wt 264 lb 9.6 oz (120 kg)   SpO2 96%   BMI 32.21 kg/m     Wt Readings from Last 3 Encounters:  08/17/20 264 lb 9.6 oz (120 kg)  07/27/20 268 lb 15.4 oz (122 kg)  07/16/20 269 lb (122 kg)     GEN:  Well nourished, well developed in no acute distress HEENT: Normal NECK: No JVD; No carotid bruits LYMPHATICS: No lymphadenopathy CARDIAC: RRR, no murmurs, rubs, gallops RESPIRATORY:  Clear to auscultation without rales, wheezing or rhonchi  ABDOMEN: Soft, non-tender, non-distended MUSCULOSKELETAL:  No edema; No deformity  SKIN: Warm and dry NEUROLOGIC:  Alert and oriented x 3 PSYCHIATRIC:  Normal affect   ASSESSMENT:    1. Pre-op evaluation   2. Primary hypertension   3. Pure hypercholesterolemia    PLAN:    In order of problems listed above:  1. Preop evaluation for left knee arthroplasty.  Denies chest pain or shortness of breath.  Significant risk factors of hypertension, hyperlipidemia, diabetes, family history of CAD.  Echocardiogram showed normal systolic function, EF 55 to 60%.  Lexiscan Myoview with no evidence for ischemia.  Okay to proceed with surgery from a cardiac perspective. 2. Hypertension, BP controlled, continue lisinopril. 3. Hyperlipidemia, continue statin.   Follow-up in as needed    Medication Adjustments/Labs and Tests Ordered: Current medicines are reviewed at length with the patient today.  Concerns regarding medicines are outlined above.  Orders Placed This  Encounter  Procedures  . EKG 12-Lead   No orders of the defined types were placed in this encounter.   Patient Instructions  Medication Instructions:  Your physician recommends that you continue on your current medications as directed. Please refer to the Current Medication list given to you today.  *If you need a refill on your cardiac medications before your next appointment, please call your pharmacy*   Lab Work: None ordered If you have labs (blood work) drawn today and your tests are completely normal, you will receive your results only by: 09/15/20 MyChart Message (if you have MyChart) OR . A paper copy in the mail If you have any lab test that is abnormal or we  need to change your treatment, we will call you to review the results.   Testing/Procedures: None ordered   Follow-Up: At [facility], you and your health needs are our priority.  As part of our continuing mission to provide you with exceptional heart care, we have created designated Provider Care Teams.  These Care Teams include your primary Cardiologist (physician) and Advanced Practice Providers (APPs -  Physician Assistants and Nurse Practitioners) who all work together to provide you with the care you need, when you need it.  We recommend signing up for the patient portal called "MyChart".  Sign up information is provided on this After Visit Summary.  MyChart is used to connect with patients for Virtual Visits (Telemedicine).  Patients are able to view lab/test results, encounter notes, upcoming appointments, etc.  Non-urgent messages can be sent to your provider as well.   To learn more about what you can do with MyChart, go to [email or web address].    Your next appointment:   Follow up as needed   The format for your next appointment:   In Person  Provider:   Debbe Odea, MD   Other Instructions      Signed, Debbe Odea, MD  08/17/2020 12:42 PM    [facility]

## 2020-10-25 DIAGNOSIS — Z860100 Personal history of colon polyps, unspecified: Secondary | ICD-10-CM | POA: Insufficient documentation

## 2020-10-25 DIAGNOSIS — K5730 Diverticulosis of large intestine without perforation or abscess without bleeding: Secondary | ICD-10-CM | POA: Insufficient documentation

## 2020-10-25 DIAGNOSIS — R748 Abnormal levels of other serum enzymes: Secondary | ICD-10-CM | POA: Insufficient documentation

## 2020-10-25 DIAGNOSIS — K760 Fatty (change of) liver, not elsewhere classified: Secondary | ICD-10-CM | POA: Insufficient documentation

## 2021-03-06 ENCOUNTER — Ambulatory Visit
Admission: EM | Admit: 2021-03-06 | Discharge: 2021-03-06 | Disposition: A | Discharge status: Home / Self Care | Payer: Medicare HMO | Attending: Internal Medicine | Admitting: Internal Medicine

## 2021-03-06 ENCOUNTER — Encounter: Payer: Self-pay | Admitting: Emergency Medicine

## 2021-03-06 ENCOUNTER — Other Ambulatory Visit: Payer: Self-pay

## 2021-03-06 DIAGNOSIS — U071 COVID-19: Secondary | ICD-10-CM | POA: Diagnosis not present

## 2021-03-06 DIAGNOSIS — Z1152 Encounter for screening for COVID-19: Secondary | ICD-10-CM

## 2021-03-06 MED ORDER — MOLNUPIRAVIR EUA 200MG CAPSULE: 4.0000 | ORAL_CAPSULE | Freq: Two times a day (BID) | ORAL | 0 refills | Status: AC

## 2021-03-06 NOTE — ED Triage Notes (Signed)
Pt here with cough, sore throat, and fever since yesterday. Test positive for COVID on home test.

## 2021-03-06 NOTE — Discharge Instructions (Addendum)
Stay quarantined for 5 days from onset of symptoms, then the following 5 days you need to wear a mask in public.  Don't lay around, keep active and walk as much as you are able to to prevent worsening of your symptoms.  Follow up with your family Dr next week.  If you get short of breath and you are able to check  your oxygen with a pulse oxygen meter, if it gets to 92% or less, you need to go to the hospital to be admitted. If you dont have one, come back here and we will assess you.

## 2021-03-06 NOTE — ED Provider Notes (Signed)
Mitchell Case    CSN: [id number] Arrival date & time: 03/06/21  1732      History   Chief Complaint Chief Complaint  Patient presents with   Cough   Sore Throat   Fever    HPI Mitchell Case is a 65 y.o. male who presents with onset of cough, ST and fever since yesterday. Did not have a fever yesterday. Tested positive for covid at home. Today started feeling fatigued.  Has temp 101 this pm. Has never had Covid. Has had 3 Covid injections.     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), bilateral 07/26/2020   Kidney stone 07/26/2020   Sleep apnea 07/26/2020   Primary osteoarthritis of left knee 06/24/2020   Type 2 diabetes mellitus without complication, without long-term current use of insulin (HCC) 02/26/2016   Essential hypertension 07/17/2014   Pure hypercholesterolemia 07/17/2014   Iron deficiency anemia due to chronic blood loss 04/10/2014   Lower GI bleed 01/02/2014   Difficult intubation 11/11/2012    Past Surgical History:  Procedure Laterality Date   ACHILLES TENDON REPAIR     COLONOSCOPY     KIDNEY STONE SURGERY Bilateral    KNEE ARTHROPLASTY Left 07/27/2020   Procedure: COMPUTER ASSISTED TOTAL KNEE ARTHROPLASTY;  Surgeon: Donato Heinz, MD;  Location: [facility] ORS;  Service: Orthopedics;  Laterality: Left;       Home Medications    Prior to Admission medications   Medication Sig Start Date End Date Taking? Authorizing Provider  molnupiravir EUA (LAGEVRIO) 200 mg CAPS capsule Take 4 capsules (800 mg  total) by mouth 2 (two) times daily for 5 days. 03/06/21 03/11/21 Yes Rodriguez-Southworth, Nettie Elm, PA-C  celecoxib (CELEBREX) 200 MG capsule Take 1 capsule (200 mg total) by mouth 2 (two) times daily. 07/28/20   Madelyn Flavors, PA-C  fluticasone (FLONASE) 50 MCG/ACT nasal spray Place 1 spray into both nostrils daily as needed for allergies or rhinitis.    [provider]  liraglutide (VICTOZA) 18 MG/3ML SOPN Inject 0.6 mg into the skin daily.    [provider]  lisinopril (PRINIVIL,ZESTRIL) 10 MG tablet Take 10 mg by mouth at bedtime.    [provider]  metFORMIN (GLUCOPHAGE) 1000 MG tablet Take 1,000 mg by mouth 2 (two) times daily with a meal.    [provider]  Multiple Vitamins-Minerals (OCUVITE PO) Take 1 tablet by mouth daily.    [provider]  oxyCODONE (OXY IR/ROXICODONE) 5 MG immediate release tablet Take 1 tablet (5 mg total) by mouth every 4 (four) hours as needed for moderate pain (pain score 4-6). 07/28/20   [name] B, PA-C  rosuvastatin (CRESTOR) 5 MG tablet Take 5 mg by mouth every Monday. At bedtime    [provider]  tamsulosin (FLOMAX) 0.4 MG CAPS capsule Take 0.4 mg by mouth at bedtime.    [provider]    Family History History reviewed. No pertinent family history.  Social History Social History   Tobacco Use   Smoking status: Never   Smokeless tobacco: Never  Vaping Use   Vaping Use: Never used  Substance Use Topics   Alcohol use: No   Drug use: No     Allergies   Penicillins, Sulfa antibiotics, Atorvastatin, and Hydrochlorothiazide   Review of Systems Review of Systems  Constitutional:  Positive for appetite change, chills, fatigue and fever. Negative for activity change and diaphoresis.  HENT:  Positive for congestion, postnasal drip, rhinorrhea and sore throat. Negative for ear discharge, ear pain and trouble swallowing.   Eyes:  Negative for discharge.  Respiratory:   Positive for cough. Negative for chest tightness and shortness of breath.   Cardiovascular:  Negative for chest pain.  Gastrointestinal:  Negative for diarrhea, nausea and vomiting.  Musculoskeletal:  Positive for myalgias. Negative for gait problem.  Skin:  Negative for rash.  Neurological:  Positive for headaches. Negative for weakness.  Hematological:  Negative for adenopathy.    Physical Exam Triage Vital Signs ED Triage Vitals  Enc Vitals Group     BP 03/06/21 1825 (!) 150/82     Pulse Rate 03/06/21 1825 (!) 106     Resp 03/06/21 1825 20     Temp 03/06/21 1825 100.3 F (37.9 C)     Temp src --      SpO2 03/06/21 1825 98 %     Weight --      Height --      Head Circumference --      Peak Flow --      Pain Score 03/06/21 1826 3     Pain Loc --      Pain Edu? --      Excl. in GC? --    No data found.  Updated Vital Signs BP (!) 150/82   Pulse (!) 106   Temp 100.3 F (37.9 C)   Resp 20   SpO2 98%   Visual Acuity Right Eye Distance:   Left Eye Distance:   Bilateral Distance:    Right Eye Near:   Left Eye Near:    Bilateral Near:     Physical Exam Physical Exam Vitals signs and nursing note reviewed.  Constitutional:      General: he is not in acute distress.    Appearance: Normal appearance. He is not ill-appearing, toxic-appearing or diaphoretic.  HENT:     Head: Normocephalic.     Right Ear: Tympanic membrane, ear canal and external ear normal.     Left Ear: Tympanic membrane, ear canal and external ear normal.     Nose: Nose normal.     Mouth/Throat:     Mouth: Mucous membranes are moist.  Eyes:     General: No scleral icterus.       Right eye: No discharge.        Left eye: No discharge.     Conjunctiva/sclera: Conjunctivae normal.  Neck:     Musculoskeletal: Neck supple. No neck rigidity.  Cardiovascular:     Rate and Rhythm: Normal rate and regular rhythm.     Heart sounds: No murmur.  Pulmonary:     Effort: Pulmonary effort is normal.      Breath sounds: Normal breath sounds.  Musculoskeletal: Normal range of motion.  Lymphadenopathy:     Cervical: No cervical adenopathy.  Skin:    General: Skin is warm and dry.     Coloration: Skin is not jaundiced.     Findings:  No rash.  Neurological:     Mental Status: he is alert and oriented to person, place, and time.     Gait: Gait normal.  Psychiatric:        Mood and Affect: Mood normal.        Behavior: Behavior normal.        Thought Content: Thought content normal.        Judgment: Judgment normal.    UC Treatments / Results  Labs (all labs ordered are listed, but only abnormal results are displayed) Labs Reviewed  NOVEL CORONAVIRUS, NAA    EKG   Radiology No results found.  Procedures Procedures (including critical care time)  Medications Ordered in UC Medications - No data to display  Initial Impression / Assessment and Plan / UC Course  I have reviewed the triage vital signs and the nursing notes. Has covid infection I placed him on Tessalon and Molnupiravir  as noted. See instructions.     Final Clinical Impressions(s) / UC Diagnoses   Final diagnoses:  Encounter for screening for COVID-19  COVID-19 virus infection     Discharge Instructions      Stay quarantined for 5 days from onset of symptoms, then the following 5 days you need to wear a mask in public.  Don't lay around, keep active and walk as much as you are able to to prevent worsening of your symptoms.  Follow up with your family Dr next week.  If you get short of breath and you are able to check  your oxygen with a pulse oxygen meter, if it gets to 92% or less, you need to go to the hospital to be admitted. If you dont have one, come back here and we will assess you.       ED Prescriptions     Medication Sig Dispense Auth. Provider   molnupiravir EUA (LAGEVRIO) 200 mg CAPS capsule Take 4 capsules (800 mg total) by mouth 2 (two) times daily for 5 days. 40 capsule  Rodriguez-Southworth, Nettie Elm, PA-C      PDMP not reviewed this encounter.   Garey Ham, Cordelia Poche 03/06/21 2040

## 2021-03-07 LAB — SARS-COV-2, NAA 2 DAY TAT

## 2021-03-07 LAB — NOVEL CORONAVIRUS, NAA: SARS-CoV-2, NAA: DETECTED — AB

## 2021-03-08 ENCOUNTER — Telehealth: Payer: Self-pay | Admitting: Internal Medicine

## 2021-03-21 NOTE — Telephone Encounter (Signed)
.    This was opened in error.

## 2021-05-22 DIAGNOSIS — E6609 Other obesity due to excess calories: Secondary | ICD-10-CM | POA: Insufficient documentation

## 2021-08-03 NOTE — Discharge Instructions (Signed)
Instructions after Total Knee Replacement   Alvena Kiernan P. Finley Dinkel, Jr., M.D.     Dept. of Orthopaedics & Sports Medicine  [facility]  [address]  [place], [place]  [address]  Phone: [phone]   Fax: [phone]    DIET: Drink plenty of non-alcoholic fluids. Resume your normal diet. Include foods high in fiber.  ACTIVITY:  You may use crutches or a walker with weight-bearing as tolerated, unless instructed otherwise. You may be weaned off of the walker or crutches by your Physical Therapist.  Do NOT place pillows under the knee. Anything placed under the knee could limit your ability to straighten the knee.   Continue doing gentle exercises. Exercising will reduce the pain and swelling, increase motion, and prevent muscle weakness.   Please continue to use the TED compression stockings for 6 weeks. You may remove the stockings at night, but should reapply them in the morning. Do not drive or operate any equipment until instructed.  WOUND CARE:  Continue to use the PolarCare or ice packs periodically to reduce pain and swelling. You may bathe or shower after the staples are removed at the first office visit following surgery.  MEDICATIONS: You may resume your regular medications. Please take the pain medication as prescribed on the medication. Do not take pain medication on an empty stomach. You have been given a prescription for a blood thinner (Lovenox or Coumadin). Please take the medication as instructed. (NOTE: After completing a 2 week course of Lovenox, take one Enteric-coated aspirin once a day. This along with elevation will help reduce the possibility of phlebitis in your operated leg.) Do not drive or drink alcoholic beverages when taking pain medications.  CALL THE OFFICE FOR: Temperature above 101 degrees Excessive bleeding or drainage on the dressing. Excessive swelling, coldness, or paleness of the toes. Persistent nausea and vomiting.  FOLLOW-UP:  You  should have an appointment to return to the office in 10-14 days after surgery. Arrangements have been made for continuation of Physical Therapy (either home therapy or outpatient therapy).   [facility] Department Directory         [email or web address]       [email or web address]          Cardiology  Appointments: [place] - [phone] [place] - [phone]  Endocrinology  Appointments: [place] - [phone] [place] - [phone]  Gastroenterology  Appointments: [place] - [phone] [place] - [phone]        General Surgery   Appointments: [place] - [phone]  Internal Medicine/Family Medicine  Appointments: [place] - [phone] [place] - [phone] [place] - [phone]  Metabolic and Weigh Loss Surgery  Appointments: [place] - [phone]        Neurology  Appointments: [place] - [phone] [place] - [phone]  Neurosurgery  Appointments: [place] - [phone]  Obstetrics & Gynecology  Appointments: [place] - [phone] [place] - [phone]        Pediatrics  Appointments: [place] - [phone] [place] - [phone]  Physiatry  Appointments: [place] -[phone]  Physical Therapy  Appointments: [place] - [phone] [place] - [phone]        Podiatry  Appointments: [place] - [phone] [place] - [phone]  Pulmonology  Appointments: [place] - [phone]  Rheumatology  Appointments: [place] - [phone]        [place] Location: [facility]  [address] , [place]  [address]  [place] Location: [facility] [address] [place], [place]  [address]  [place] Location: [facility] [address] [place], [place]  [address]    

## 2021-08-21 ENCOUNTER — Other Ambulatory Visit: Payer: Self-pay

## 2021-08-21 ENCOUNTER — Encounter
Admission: RE | Admit: 2021-08-21 | Discharge: 2021-08-21 | Disposition: A | Discharge status: Home / Self Care | Payer: Medicare HMO | Source: Ambulatory Visit | Attending: Orthopedic Surgery | Admitting: Orthopedic Surgery

## 2021-08-21 VITALS — BP 119/87 | HR 83 | Temp 97.6°F | Resp 20 | Ht 76.0 in | Wt 265.9 lb

## 2021-08-21 DIAGNOSIS — Z01818 Encounter for other preprocedural examination: Secondary | ICD-10-CM | POA: Diagnosis present

## 2021-08-21 DIAGNOSIS — Z96651 Presence of right artificial knee joint: Secondary | ICD-10-CM

## 2021-08-21 DIAGNOSIS — M1711 Unilateral primary osteoarthritis, right knee: Secondary | ICD-10-CM | POA: Insufficient documentation

## 2021-08-21 DIAGNOSIS — E119 Type 2 diabetes mellitus without complications: Secondary | ICD-10-CM | POA: Diagnosis not present

## 2021-08-21 DIAGNOSIS — Z01810 Encounter for preprocedural cardiovascular examination: Secondary | ICD-10-CM | POA: Diagnosis not present

## 2021-08-21 DIAGNOSIS — D500 Iron deficiency anemia secondary to blood loss (chronic): Secondary | ICD-10-CM | POA: Insufficient documentation

## 2021-08-21 DIAGNOSIS — E7800 Pure hypercholesterolemia, unspecified: Secondary | ICD-10-CM | POA: Diagnosis not present

## 2021-08-21 LAB — CBC
HCT: 40.7 % (ref 39.0–52.0)
Hemoglobin: 13.8 g/dL (ref 13.0–17.0)
MCH: 31.4 pg (ref 26.0–34.0)
MCHC: 33.9 g/dL (ref 30.0–36.0)
MCV: 92.5 fL (ref 80.0–100.0)
Platelets: 274 10*3/uL (ref 150–400)
RBC: 4.4 MIL/uL (ref 4.22–5.81)
RDW: 12.4 % (ref 11.5–15.5)
WBC: 6.8 10*3/uL (ref 4.0–10.5)
nRBC: 0 % (ref 0.0–0.2)

## 2021-08-21 LAB — COMPREHENSIVE METABOLIC PANEL
ALT: 53 U/L — ABNORMAL HIGH (ref 0–44)
AST: 45 U/L — ABNORMAL HIGH (ref 15–41)
Albumin: 4.2 g/dL (ref 3.5–5.0)
Alkaline Phosphatase: 35 U/L — ABNORMAL LOW (ref 38–126)
Anion gap: 10 (ref 5–15)
BUN: 16 mg/dL (ref 8–23)
CO2: 19 mmol/L — ABNORMAL LOW (ref 22–32)
Calcium: 9.3 mg/dL (ref 8.9–10.3)
Chloride: 108 mmol/L (ref 98–111)
Creatinine, Ser: 1.08 mg/dL (ref 0.61–1.24)
GFR, Estimated: >60 mL/min (ref >60)
Glucose, Bld: 113 mg/dL — ABNORMAL HIGH (ref 70–99)
Potassium: 4.4 mmol/L (ref 3.5–5.1)
Sodium: 137 mmol/L (ref 135–145)
Total Bilirubin: 0.8 mg/dL (ref 0.3–1.2)
Total Protein: 7.7 g/dL (ref 6.5–8.1)

## 2021-08-21 LAB — URINALYSIS, ROUTINE W REFLEX MICROSCOPIC
Bilirubin Urine: NEGATIVE
Glucose, UA: NEGATIVE mg/dL
Hgb urine dipstick: NEGATIVE
Ketones, ur: NEGATIVE mg/dL
Leukocytes,Ua: NEGATIVE
Nitrite: NEGATIVE
Protein, ur: NEGATIVE mg/dL
Specific Gravity, Urine: 1.004 — ABNORMAL LOW (ref 1.005–1.030)
pH: 5 (ref 5.0–8.0)

## 2021-08-21 LAB — HEMOGLOBIN A1C
Hgb A1c MFr Bld: 6.7 % — ABNORMAL HIGH (ref 4.8–5.6)
Mean Plasma Glucose: 145.59 mg/dL

## 2021-08-21 LAB — SEDIMENTATION RATE: Sed Rate: 17 mm/hr (ref 0–20)

## 2021-08-21 LAB — SURGICAL PCR SCREEN
MRSA, PCR: NEGATIVE
Staphylococcus aureus: NEGATIVE

## 2021-08-21 LAB — C-REACTIVE PROTEIN: CRP: 0.6 mg/dL (ref <1.0)

## 2021-08-21 NOTE — Patient Instructions (Addendum)
Your procedure is scheduled on: 08/30/2021   ?Report to the Registration Desk on the 1st floor of the [facility]. ?To find out your arrival time, please call [phone] between 1PM - 3PM on: 08/29/2021  ?If your arrival time is 6:00 am, do not arrive prior to that time as the [facility] entrance doors do not open until 6:00 am. ? ?REMEMBER: ?Instructions that are not followed completely may result in serious medical risk, up to and including death; or upon the discretion of your surgeon and anesthesiologist your surgery may need to be rescheduled. ? ?Do not eat food after midnight the night before surgery.  ?No gum chewing, lozengers or hard candies. ? ?You may however, drink CLEAR liquids up to 2 hours before you are scheduled to arrive for your surgery. Do not drink anything within 2 hours of your scheduled arrival time. ? ?Clear liquids include: ?- water and G2 ? ?Type 2 diabetics should only drink water. ? ?In addition, your doctor has ordered for you to drink the provided  ?Gatorade G2 ?Drinking this carbohydrate drink up to two hours before surgery helps to reduce insulin resistance and improve patient outcomes. Please complete drinking 2 hours prior to scheduled arrival time. ? ? ? ?**Follow new guidelines for insulin and diabetes medications.** ?Take Victoza as prescribed but hold on day of surgery. ?Do not take metFORMIN on 5/17/,5/18/ and 5/19 ? ?Follow recommendations from Cardiologist, Pulmonologist or PCP regarding stopping Aspirin. You were instructed by the Pre-admission testing RN to call the surgeon's office regarding aspirin. The Pre-op RN will ask you this as well. ? ?One week prior to surgery: ?Stop Anti-inflammatories (NSAIDS) such as Advil, Aleve, Ibuprofen, Motrin, Naproxen, Naprosyn and Aspirin based products such as Excedrin, Goodys Powder, BC Powder. ?Stop ANY OVER THE COUNTER supplements until after surgery like multivitamin and turmeric  ?You may however, continue to take Tylenol if  needed for pain up until the day of surgery. ? ?No Alcohol for 24 hours before or after surgery. ? ?No Smoking including e-cigarettes for 24 hours prior to surgery.  ?No chewable tobacco products for at least 6 hours prior to surgery.  ?No nicotine patches on the day of surgery. ? ?Do not use any "recreational" drugs for at least a week prior to your surgery.  ?Please be advised that the combination of cocaine and anesthesia may have negative outcomes, up to and including death. ?If you test positive for cocaine, your surgery will be cancelled. ? ?On the morning of surgery brush your teeth with toothpaste and water, you may rinse your mouth with mouthwash if you wish. ?Do not swallow any toothpaste or mouthwash. ? ?Use CHG Soap with shower as directed on instruction sheet.=provided for you. ? ?Do not wear jewelry, make-up, hairpins, clips or nail polish. ? ?Do not wear lotions, powders, or perfumes.  ? ?Do not shave body from the neck down 48 hours prior to surgery just in case you cut yourself which could leave a site for infection.  ?Also, freshly shaved skin may become irritated if using the CHG soap. ? ?Contact lenses, hearing aids and dentures may not be worn into surgery. ? ?Do not bring valuables to the hospital. [facility] is not responsible for any missing/lost belongings or valuables.  ? ?Bring your C-PAP to the hospital with you in case you may have to spend the night.  ? ?Notify your doctor if there is any change in your medical condition (cold, fever, infection). ? ?Wear comfortable clothing (  specific to your surgery type) to the hospital. ? ?After surgery, you can help prevent lung complications by doing breathing exercises.  ?Take deep breaths and cough every 1-2 hours. Your doctor may order a device called an Incentive Spirometer to help you take deep breaths. ? ? ?If you are being admitted to the hospital overnight, leave your suitcase in the car. ?After surgery it may be brought to your  room. ? ?If you are being discharged the day of surgery, you will not be allowed to drive home. ?You will need a responsible adult (18 years or older) to drive you home and stay with you that night.  ? ?If you are taking public transportation, you will need to have a responsible adult (18 years or older) with you. ?Please confirm with your physician that it is acceptable to use public transportation.  ? ?Please call the Pre-admissions Testing Dept. at [phone] if you have any questions about these instructions. ? ?Surgery Visitation Policy: ? ?Patients undergoing a surgery or procedure may have two family members or support persons with them as long as the person is not COVID-19 positive or experiencing its symptoms.  ? ?Inpatient Visitation:   ? ?Visiting hours are 7 a.m. to 8 p.m. ?Up to four visitors are allowed at one time in a patient room, including children. The visitors may rotate out with other people during the day. One designated support person (adult) may remain overnight.  ?

## 2021-08-25 NOTE — H&P (Signed)
ORTHOPAEDIC HISTORY & PHYSICAL ?Mitchell Case [place], [place] - 08/22/2021 1:45 PM EDT ?Formatting of this note is different from the original. ?[facility] ?ORTHOPAEDICS AND SPORTS MEDICINE ?Chief Complaint:  ? ?Chief Complaint  ?Patient presents with  ? Knee Pain  ?H & P RIGHT KNEE  ? ?History of Present Illness:  ? ?Mitchell Case is a 66 y.o. male that presents to clinic today for his preoperative history and evaluation. Patient presents with his wife. The patient is scheduled to undergo a right total knee arthroplasty on 08/30/2021 by Dr. Ernest Pine. His pain began several years ago. The pain is located primarily along the medial aspect of the knee. He describes his pain as worse with weightbearing. He reports associated swelling. He denies associated numbness or tingling, denies locking or giving way.  ? ?The patient's symptoms have progressed to the point that they decrease his quality of life. The patient has previously undergone conservative treatment including NSAIDS and injections to the knee without adequate control of his symptoms. ? ?Denies history of lumbar surgery, history of DVT. He has been cleared by Dr Azucena Cecil last summer after undergoing left total knee arthroplasty.  ? ?Reports penicillin allergy but IgE within normal limits. Patient is a type II diabetic, last A1c was 6.7 on 08/21/2021. ? ?Past Medical, Surgical, Family, Social History, Allergies, Medications:  ? ?Past Medical History:  ?Past Medical History:  ?Diagnosis Date  ? Anesthesia complication  ?hiccups for one hour postop- 3 surgeries, shivering after lithotripsy  ? 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 Arthritis  ? Diabetes mellitus type 2, uncomplicated (CMS-HCC)  ?diet controlled  ? Difficult airway  ?see 11-11-12 anesthesia note  ? Diverticulosis  ? Encounter for blood transfusion  ? History of hepatitis  ?age 89 (pt states he had strep throat & mono & told  he also had hepatitis)  ? Hyperlipidemia  ? Hypertension  ? Kidney stone  ?h/o  ? Sleep apnea  ?cpap-  ? ?Past Surgical History:  ?Past Surgical History:  ?Procedure Laterality Date  ? PR CYSTO/URETERO W/LITHOTRIPSY &INDWELL STENT INSRT Right 11/11/2012  ?Procedure: Cystoscopy/Right Ureteroscopy with Holmium Laser-Basket Stone Extraction/Right Retrograde Pyelogram/Right JJ Stent; Surgeon: Jac Canavan, MD; Location: [facility] OR; Service: Urology; Laterality: Right;  ? INTRAOPERATIVE FLUOROSCOPY Right 11/11/2012  ?Procedure: FLUOROSCOPE EXAM >1 HR EXTENSIVE; Surgeon: Jac Canavan, MD; Location: [facility] OR; Service: Urology; Laterality: Right;  ? COLONOSCOPY N/A 01/03/2014  ?Procedure: Colonoscopy; Surgeon: Rush Landmark, MD; Location: [facility] ENDO/BRONCH; Service: Gastroenterology; Laterality: N/A;  ? EGD N/A 01/04/2014  ?Procedure: EGD; Surgeon: Rush Landmark, MD; Location: [facility] ENDO/BRONCH; Service: Gastroenterology; Laterality: N/A;  ? COLONOSCOPY 01/04/2014  ?Procedure: COLONOSCOPY, FLEXIBLE; DIAGNOSTIC, INCLUDING COLLECTION OF SPECIMEN(S) BY BRUSHING OR WASHING, WHEN PERFORMED (SEPARATE PROCEDURE); Surgeon: Rush Landmark, MD; Location: [facility] ENDO/BRONCH; Service: Gastroenterology;;  ? CYSTOURETHROSCOPY W/INSERTION URETHRAL STENT Left 10/24/2014  ?Procedure: CYSTOURETHROSCOPY WITH INSERTION URETHRAL STENT; Surgeon: Toney Reil, MD; Location: [facility] OR; Service: Urology; Laterality: Left;  ? PR CYSTO/URETERO W/LITHOTRIPSY &INDWELL STENT INSRT Left 12/21/2014  ?Procedure: Cystoscopy/Left Ureteroscopy with Holmium Laser/Left JJ Stent (2nd Stage); Surgeon: Jac Canavan, MD; Location: [facility] OR; Service: Urology; Laterality: Left;  ? INTRAOPERATIVE FLUOROSCOPY Left 12/21/2014  ?Procedure: FLUOROSCOPY, PHYSICIAN OR OTHER QUALIFIED HEALTH CARE PROFESSIONAL TIME MORE THAN 1 HOUR, ASSISTING A NONRADIOLOGIC PHYSICIAN OR OTHER QUALIFIED HEALTH CARE PROFESSIONAL; Surgeon: Jac Canavan, MD; Location: [facility]  OR; Service: Urology; Laterality: Left;  ?  PR CYSTO/URETERO W/LITHOTRIPSY &INDWELL STENT INSRT Left 07/16/2017  ?Procedure: Cystoscopy/Left Distal Ureteroscopy with Holmium Laser-Basket Stone Extraction/Left Retrograde Pyelogram/Left JJ Stent; Surgeon: Jac CanavanHouser, Edward Ross II, MD; [facility]; Service: Urology; Laterality: Left;  ? INTRAOPERATIVE FLUOROSCOPY Left 07/16/2017  ?Procedure: FLUOROSCOPY; Surgeon: Jac CanavanHouser, Edward Ross II, MD; [facility] OR; Service: Urology; Laterality: Left;  ? Left total knee arthroplasty using computer-assisted navigation 07/27/2020  ?Dr Ernest PineHooten  ? COLONOSCOPY 12/18/2020  ?Diverticulosis/Otherwise normal colon/PHx CP/Repeat 5053yrs/TKT  ? ACHILLES TENDON REPAIR  ? COLONOSCOPY  ? ?Current Medications:  ?Current Outpatient Medications  ?Medication Sig Dispense Refill  ? acetaminophen (TYLENOL) 500 MG tablet Take 1,000 mg by mouth once daily as needed for Pain  ? aspirin 81 MG chewable tablet Take 81 mg by mouth once daily  ? beta-carotene,A,-vits C,E/mins (OCUVITE ORAL) Take 1 tablet by mouth nightly  ? cephalexin (KEFLEX) 500 MG capsule TAKE 4 TABS 1HOUR PRIOR DENTAL VISIT  ? diazePAM (VALIUM) 5 MG tablet Take 1 tablet (5 mg total) by mouth every 12 (twelve) hours as needed for Anxiety 10 tablet 0  ? liraglutide (VICTOZA 2-PAK) 0.6 mg/0.1 mL (18 mg/3 mL) pen injector Inject 0.6 mg daily as directed 9 mL 0  ? lisinopriL (ZESTRIL) 10 MG tablet Take 1 tablet (10 mg total) by mouth once daily 90 tablet 0  ? meclizine (ANTIVERT) 12.5 mg tablet Take 12.5 mg by mouth once daily as needed  ? metFORMIN (GLUCOPHAGE) 1000 MG tablet TAKE 1 TABLET BY MOUTH TWICE DAILY WITH MEALS 180 tablet 0  ? pen needle, diabetic (BD ULTRA-FINE MICRO PEN NEEDLE) 32 gauge x 1/4" needle Use daily 100 each 3  ? rosuvastatin (CRESTOR) 5 MG tablet Take 1 tablet (5 mg total) by mouth at bedtime 90 tablet 1  ? tamsulosin (FLOMAX) 0.4 mg capsule Take 1 capsule (0.4 mg total) by mouth at bedtime Take 30 minutes after  same meal each day. 90 capsule 1  ? TURMERIC ORAL Take 1 capsule by mouth once daily  ? meclizine (ANTIVERT) 25 mg tablet Take 1 tablet (25 mg total) by mouth 3 (three) times daily as needed for Dizziness 10 tablet 0  ? TUMERIC-GING-OLIVE-OREG-CAPRYL ORAL Take 1 capsule by mouth once daily  ? ?No current facility-administered medications for this visit.  ? ?Allergies:  ?Allergies  ?Allergen Reactions  ? Penicillins Swelling  ?Swelling and itching of hands as a child, no issues breathing ?IgE = 29 (WNL) on 07/13/2020 ? ?Has patient had a PCN reaction causing immediate rash, facial/tongue/throat swelling, SOB or lightheadedness with hypotension: No ?Has patient had a PCN reaction causing severe rash involving mucus membranes or skin necrosis: No ?Has patient had a PCN reaction that required hospitalization No ?Has patient had a PCN reaction occurring within the last 10 years: No ?If all of the above answers are "NO", then may proceed with Cephalosporin use.  ? Atorvastatin Muscle Pain  ? Hydrochlorothiazide (Bulk) Itching  ? Sulfa (Sulfonamide Antibiotics) Itching  ? ?Social History:  ?Social History  ? ?Socioeconomic History  ? Marital status: Married  ?Spouse name: Selena BattenKim  ? Number of children: 2  ? Years of education: 7414  ? Highest education level: Associate degree: occupational, Scientist, product/process developmenttechnical, or vocational program  ?Occupational History  ? Occupation: RetiredMetallurgist- [employer]  ?Tobacco Use  ? Smoking status: Former  ?Packs/day: 1.00  ?Years: 3.00  ?Pack years: 3.00  ?Types: Cigars, Cigarettes  ?Quit date: 961983  ?Years since quitting: 40.3  ?Passive exposure: Past  ? Smokeless tobacco: Former  ?  Types: Snuff, Chew  ? Tobacco comments:  ?Occasional cigars  ?Vaping Use  ? Vaping Use: Never used  ?Substance and Sexual Activity  ? Alcohol use: No  ? Drug use: No  ? Sexual activity: Yes  ?Partners: Female  ?Social History Narrative  ?Married, lives wife and two dogs. One grown son and one grown daughter. Four grandchildren.  Grandchildren live very close by. 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  ? ?Family History:  ?Family History  ?Problem Relation Age of Onset  ? Diabetes Mother  ? Brain cancer Mother  ?at age 11  ? Coronary Art

## 2021-08-30 ENCOUNTER — Observation Stay: Payer: Medicare HMO

## 2021-08-30 ENCOUNTER — Observation Stay
Admission: RE | Admit: 2021-08-30 | Discharge: 2021-08-31 | Disposition: A | Discharge status: Home Health | Payer: Medicare HMO | Attending: Orthopedic Surgery | Admitting: Orthopedic Surgery

## 2021-08-30 ENCOUNTER — Other Ambulatory Visit: Payer: Self-pay

## 2021-08-30 ENCOUNTER — Encounter: Payer: Self-pay | Admitting: Orthopedic Surgery

## 2021-08-30 ENCOUNTER — Encounter
Admission: RE | Disposition: A | Discharge status: Home Health | Payer: Self-pay | Source: Home / Self Care | Attending: Orthopedic Surgery

## 2021-08-30 ENCOUNTER — Ambulatory Visit: Payer: Medicare HMO | Admitting: Urgent Care

## 2021-08-30 ENCOUNTER — Ambulatory Visit: Payer: Medicare HMO | Admitting: Anesthesiology

## 2021-08-30 DIAGNOSIS — Z79899 Other long term (current) drug therapy: Secondary | ICD-10-CM | POA: Insufficient documentation

## 2021-08-30 DIAGNOSIS — I10 Essential (primary) hypertension: Secondary | ICD-10-CM | POA: Diagnosis not present

## 2021-08-30 DIAGNOSIS — Z96659 Presence of unspecified artificial knee joint: Secondary | ICD-10-CM

## 2021-08-30 DIAGNOSIS — M1711 Unilateral primary osteoarthritis, right knee: Secondary | ICD-10-CM | POA: Diagnosis present

## 2021-08-30 DIAGNOSIS — D500 Iron deficiency anemia secondary to blood loss (chronic): Secondary | ICD-10-CM

## 2021-08-30 DIAGNOSIS — E119 Type 2 diabetes mellitus without complications: Secondary | ICD-10-CM | POA: Insufficient documentation

## 2021-08-30 DIAGNOSIS — Z87891 Personal history of nicotine dependence: Secondary | ICD-10-CM | POA: Diagnosis not present

## 2021-08-30 DIAGNOSIS — E7800 Pure hypercholesterolemia, unspecified: Secondary | ICD-10-CM

## 2021-08-30 DIAGNOSIS — Z7982 Long term (current) use of aspirin: Secondary | ICD-10-CM | POA: Diagnosis not present

## 2021-08-30 DIAGNOSIS — Z7984 Long term (current) use of oral hypoglycemic drugs: Secondary | ICD-10-CM | POA: Diagnosis not present

## 2021-08-30 HISTORY — PX: KNEE ARTHROPLASTY: SHX992

## 2021-08-30 LAB — GLUCOSE, CAPILLARY
Glucose-Capillary: 158 mg/dL — ABNORMAL HIGH (ref 70–99)
Glucose-Capillary: 194 mg/dL — ABNORMAL HIGH (ref 70–99)
Glucose-Capillary: 193 mg/dL — ABNORMAL HIGH (ref 70–99)
Glucose-Capillary: 276 mg/dL — ABNORMAL HIGH (ref 70–99)
Glucose-Capillary: 144 mg/dL — ABNORMAL HIGH (ref 70–99)

## 2021-08-30 LAB — TYPE AND SCREEN
ABO/RH(D): A POS
Antibody Screen: POSITIVE
Unit division: 0
Unit division: 0

## 2021-08-30 LAB — BPAM RBC
Blood Product Expiration Date: 202306042359
Blood Product Expiration Date: 202306072359
Unit Type and Rh: 6200
Unit Type and Rh: 6200

## 2021-08-30 SURGERY — ARTHROPLASTY, KNEE, TOTAL, USING IMAGELESS COMPUTER-ASSISTED NAVIGATION
Anesthesia: Spinal | Site: Knee | Laterality: Right

## 2021-08-30 MED ORDER — FENTANYL CITRATE (PF) 100 MCG/2ML IJ SOLN
INTRAMUSCULAR | Status: AC
  Filled 2021-08-30: qty 2

## 2021-08-30 MED ORDER — SENNOSIDES-DOCUSATE SODIUM 8.6-50 MG PO TABS
1.0000 | ORAL_TABLET | Freq: Two times a day (BID) | ORAL | Status: DC
  Administered 2021-08-30 – 2021-08-31 (×2): 1 via ORAL
  Filled 2021-08-30 (×2): qty 1

## 2021-08-30 MED ORDER — CHLORHEXIDINE GLUCONATE 4 % EX LIQD
60.0000 mL | Freq: Once | CUTANEOUS | Status: AC
  Administered 2021-08-30: 4 via TOPICAL

## 2021-08-30 MED ORDER — ACETAMINOPHEN 10 MG/ML IV SOLN
1000.0000 mg | Freq: Four times a day (QID) | INTRAVENOUS | Status: DC
  Administered 2021-08-30 – 2021-08-31 (×3): 1000 mg via INTRAVENOUS
  Filled 2021-08-30 (×3): qty 100

## 2021-08-30 MED ORDER — LIRAGLUTIDE 18 MG/3ML ~~LOC~~ SOPN: 0.6000 mg | PEN_INJECTOR | Freq: Every day | SUBCUTANEOUS | Status: DC

## 2021-08-30 MED ORDER — FLEET ENEMA 7-19 GM/118ML RE ENEM: 1.0000 | ENEMA | Freq: Once | RECTAL | Status: DC | PRN

## 2021-08-30 MED ORDER — ORAL CARE MOUTH RINSE: 15.0000 mL | Freq: Once | OROMUCOSAL | Status: AC

## 2021-08-30 MED ORDER — CELECOXIB 200 MG PO CAPS
200.0000 mg | ORAL_CAPSULE | Freq: Two times a day (BID) | ORAL | Status: DC
  Administered 2021-08-30 – 2021-08-31 (×2): 200 mg via ORAL
  Filled 2021-08-30 (×2): qty 1

## 2021-08-30 MED ORDER — DEXMEDETOMIDINE HCL IN NACL 80 MCG/20ML IV SOLN
INTRAVENOUS | Status: AC
  Filled 2021-08-30: qty 20

## 2021-08-30 MED ORDER — PANTOPRAZOLE SODIUM 40 MG PO TBEC
40.0000 mg | DELAYED_RELEASE_TABLET | Freq: Two times a day (BID) | ORAL | Status: DC
  Administered 2021-08-30 – 2021-08-31 (×2): 40 mg via ORAL
  Filled 2021-08-30 (×2): qty 1

## 2021-08-30 MED ORDER — ALUM & MAG HYDROXIDE-SIMETH 200-200-20 MG/5ML PO SUSP: 30.0000 mL | ORAL | Status: DC | PRN

## 2021-08-30 MED ORDER — CEFAZOLIN SODIUM-DEXTROSE 1-4 GM/50ML-% IV SOLN
INTRAVENOUS | Status: DC | PRN
  Administered 2021-08-30: 1 g via INTRAVENOUS

## 2021-08-30 MED ORDER — TRANEXAMIC ACID-NACL 1000-0.7 MG/100ML-% IV SOLN
INTRAVENOUS | Status: AC
  Administered 2021-08-30: 1000 mg via INTRAVENOUS
  Filled 2021-08-30: qty 100

## 2021-08-30 MED ORDER — SURGIPHOR WOUND IRRIGATION SYSTEM - OPTIME
TOPICAL | Status: DC | PRN
Start: 2021-08-30 — End: 2021-08-30
  Administered 2021-08-30: 1

## 2021-08-30 MED ORDER — ONDANSETRON HCL 4 MG/2ML IJ SOLN: 4.0000 mg | Freq: Four times a day (QID) | INTRAMUSCULAR | Status: DC | PRN

## 2021-08-30 MED ORDER — PROPOFOL 1000 MG/100ML IV EMUL
INTRAVENOUS | Status: AC
  Filled 2021-08-30: qty 100

## 2021-08-30 MED ORDER — OXYCODONE HCL 5 MG PO TABS
ORAL_TABLET | ORAL | Status: AC
  Administered 2021-08-30: 5 mg via ORAL
  Filled 2021-08-30: qty 1

## 2021-08-30 MED ORDER — CELECOXIB 200 MG PO CAPS
ORAL_CAPSULE | ORAL | Status: AC
  Administered 2021-08-30: 400 mg via ORAL
  Filled 2021-08-30: qty 2

## 2021-08-30 MED ORDER — FAMOTIDINE 20 MG PO TABS
ORAL_TABLET | ORAL | Status: AC
  Filled 2021-08-30: qty 1

## 2021-08-30 MED ORDER — MAGNESIUM HYDROXIDE 400 MG/5ML PO SUSP
30.0000 mL | Freq: Every day | ORAL | Status: DC
  Administered 2021-08-31: 30 mL via ORAL
  Filled 2021-08-30: qty 30

## 2021-08-30 MED ORDER — CHLORHEXIDINE GLUCONATE 0.12 % MT SOLN
OROMUCOSAL | Status: AC
  Administered 2021-08-30: 15 mL via OROMUCOSAL
  Filled 2021-08-30: qty 15

## 2021-08-30 MED ORDER — FENTANYL CITRATE (PF) 100 MCG/2ML IJ SOLN
25.0000 ug | INTRAMUSCULAR | Status: DC | PRN
  Administered 2021-08-30: 25 ug via INTRAVENOUS

## 2021-08-30 MED ORDER — ONDANSETRON HCL 4 MG/2ML IJ SOLN
INTRAMUSCULAR | Status: DC | PRN
Start: 2021-08-30 — End: 2021-08-30
  Administered 2021-08-30: 4 mg via INTRAVENOUS

## 2021-08-30 MED ORDER — CEFAZOLIN SODIUM-DEXTROSE 2-4 GM/100ML-% IV SOLN
INTRAVENOUS | Status: AC
  Filled 2021-08-30: qty 100

## 2021-08-30 MED ORDER — PROPOFOL 10 MG/ML IV BOLUS
INTRAVENOUS | Status: DC | PRN
  Administered 2021-08-30: 70 mg via INTRAVENOUS

## 2021-08-30 MED ORDER — ONDANSETRON HCL 4 MG/2ML IJ SOLN
INTRAMUSCULAR | Status: AC
  Filled 2021-08-30: qty 2

## 2021-08-30 MED ORDER — TRAMADOL HCL 50 MG PO TABS
50.0000 mg | ORAL_TABLET | ORAL | Status: DC | PRN
  Administered 2021-08-30: 50 mg via ORAL
  Filled 2021-08-30: qty 1

## 2021-08-30 MED ORDER — CHLORHEXIDINE GLUCONATE 0.12 % MT SOLN: 15.0000 mL | Freq: Once | OROMUCOSAL | Status: AC

## 2021-08-30 MED ORDER — FENTANYL CITRATE (PF) 100 MCG/2ML IJ SOLN
INTRAMUSCULAR | Status: AC
Start: 2021-08-30 — End: ?
  Filled 2021-08-30: qty 2

## 2021-08-30 MED ORDER — GABAPENTIN 300 MG PO CAPS
ORAL_CAPSULE | ORAL | Status: AC
  Administered 2021-08-30: 300 mg via ORAL
  Filled 2021-08-30: qty 1

## 2021-08-30 MED ORDER — OXYCODONE HCL 5 MG PO TABS
10.0000 mg | ORAL_TABLET | ORAL | Status: DC | PRN
  Administered 2021-08-31: 10 mg via ORAL
  Filled 2021-08-30: qty 2

## 2021-08-30 MED ORDER — FLUTICASONE PROPIONATE 50 MCG/ACT NA SUSP: 1.0000 | Freq: Every day | NASAL | Status: DC | PRN

## 2021-08-30 MED ORDER — DIPHENHYDRAMINE HCL 12.5 MG/5ML PO ELIX: 12.5000 mg | ORAL_SOLUTION | ORAL | Status: DC | PRN

## 2021-08-30 MED ORDER — SODIUM CHLORIDE 0.9 % IR SOLN
Status: DC | PRN
Start: 2021-08-30 — End: 2021-08-30
  Administered 2021-08-30: 3000 mL

## 2021-08-30 MED ORDER — TRANEXAMIC ACID-NACL 1000-0.7 MG/100ML-% IV SOLN
INTRAVENOUS | Status: AC
  Filled 2021-08-30: qty 100

## 2021-08-30 MED ORDER — SODIUM CHLORIDE 0.9 % IV SOLN: INTRAVENOUS | Status: DC

## 2021-08-30 MED ORDER — CEFAZOLIN SODIUM-DEXTROSE 2-4 GM/100ML-% IV SOLN
2.0000 g | INTRAVENOUS | Status: AC
  Administered 2021-08-30: 2 g via INTRAVENOUS

## 2021-08-30 MED ORDER — MIDAZOLAM HCL 2 MG/2ML IJ SOLN
INTRAMUSCULAR | Status: AC
  Filled 2021-08-30: qty 2

## 2021-08-30 MED ORDER — GABAPENTIN 300 MG PO CAPS: 300.0000 mg | ORAL_CAPSULE | Freq: Once | ORAL | Status: AC

## 2021-08-30 MED ORDER — LACTATED RINGERS IV SOLN: INTRAVENOUS | Status: DC | PRN

## 2021-08-30 MED ORDER — INSULIN ASPART 100 UNIT/ML IJ SOLN
0.0000 [IU] | Freq: Three times a day (TID) | INTRAMUSCULAR | Status: DC
  Administered 2021-08-30: 8 [IU] via SUBCUTANEOUS
  Administered 2021-08-31: 3 [IU] via SUBCUTANEOUS
  Filled 2021-08-30 (×2): qty 1

## 2021-08-30 MED ORDER — ACETAMINOPHEN 325 MG PO TABS: 325.0000 mg | ORAL_TABLET | Freq: Four times a day (QID) | ORAL | Status: DC | PRN

## 2021-08-30 MED ORDER — OXYCODONE HCL 5 MG/5ML PO SOLN: 5.0000 mg | Freq: Once | ORAL | Status: AC | PRN

## 2021-08-30 MED ORDER — METFORMIN HCL 500 MG PO TABS
1000.0000 mg | ORAL_TABLET | Freq: Two times a day (BID) | ORAL | Status: DC
  Administered 2021-08-30 – 2021-08-31 (×2): 1000 mg via ORAL
  Filled 2021-08-30 (×2): qty 2

## 2021-08-30 MED ORDER — LACTATED RINGERS IV SOLN: INTRAVENOUS | Status: DC

## 2021-08-30 MED ORDER — PROPOFOL 500 MG/50ML IV EMUL
INTRAVENOUS | Status: DC | PRN
  Administered 2021-08-30: 150 ug/kg/min via INTRAVENOUS

## 2021-08-30 MED ORDER — BUPIVACAINE HCL (PF) 0.5 % IJ SOLN
INTRAMUSCULAR | Status: DC | PRN
  Administered 2021-08-30: 3.2 mL

## 2021-08-30 MED ORDER — PHENOL 1.4 % MT LIQD: 1.0000 | OROMUCOSAL | Status: DC | PRN

## 2021-08-30 MED ORDER — ACETAMINOPHEN 10 MG/ML IV SOLN
INTRAVENOUS | Status: AC
  Filled 2021-08-30: qty 100

## 2021-08-30 MED ORDER — SODIUM CHLORIDE 0.9 % IV SOLN
INTRAVENOUS | Status: DC
Start: 2021-08-30 — End: 2021-08-31

## 2021-08-30 MED ORDER — HYDROMORPHONE HCL 1 MG/ML IJ SOLN: 0.5000 mg | INTRAMUSCULAR | Status: DC | PRN

## 2021-08-30 MED ORDER — FAMOTIDINE 20 MG PO TABS
20.0000 mg | ORAL_TABLET | Freq: Once | ORAL | Status: AC
  Administered 2021-08-30: 20 mg via ORAL

## 2021-08-30 MED ORDER — ONDANSETRON HCL 4 MG PO TABS: 4.0000 mg | ORAL_TABLET | Freq: Four times a day (QID) | ORAL | Status: DC | PRN

## 2021-08-30 MED ORDER — DEXAMETHASONE SODIUM PHOSPHATE 10 MG/ML IJ SOLN: 8.0000 mg | Freq: Once | INTRAMUSCULAR | Status: AC

## 2021-08-30 MED ORDER — INSULIN ASPART 100 UNIT/ML IJ SOLN: 0.0000 [IU] | Freq: Every day | INTRAMUSCULAR | Status: DC

## 2021-08-30 MED ORDER — BUPIVACAINE HCL (PF) 0.25 % IJ SOLN
INTRAMUSCULAR | Status: AC
  Filled 2021-08-30: qty 60

## 2021-08-30 MED ORDER — FERROUS SULFATE 325 (65 FE) MG PO TABS
325.0000 mg | ORAL_TABLET | Freq: Two times a day (BID) | ORAL | Status: DC
  Administered 2021-08-30 – 2021-08-31 (×2): 325 mg via ORAL
  Filled 2021-08-30 (×2): qty 1

## 2021-08-30 MED ORDER — ACETAMINOPHEN 10 MG/ML IV SOLN: 1000.0000 mg | Freq: Once | INTRAVENOUS | Status: DC | PRN

## 2021-08-30 MED ORDER — ENOXAPARIN SODIUM 30 MG/0.3ML IJ SOSY
30.0000 mg | PREFILLED_SYRINGE | Freq: Two times a day (BID) | INTRAMUSCULAR | Status: DC
  Administered 2021-08-31: 30 mg via SUBCUTANEOUS
  Filled 2021-08-30: qty 0.3

## 2021-08-30 MED ORDER — LISINOPRIL 10 MG PO TABS
10.0000 mg | ORAL_TABLET | Freq: Every day | ORAL | Status: DC
  Administered 2021-08-30: 10 mg via ORAL
  Filled 2021-08-30: qty 1

## 2021-08-30 MED ORDER — SODIUM CHLORIDE FLUSH 0.9 % IV SOLN
INTRAVENOUS | Status: AC
  Filled 2021-08-30: qty 40

## 2021-08-30 MED ORDER — PHENYLEPHRINE HCL-NACL 20-0.9 MG/250ML-% IV SOLN
INTRAVENOUS | Status: DC | PRN
  Administered 2021-08-30: 20 ug/min via INTRAVENOUS

## 2021-08-30 MED ORDER — TAMSULOSIN HCL 0.4 MG PO CAPS
0.4000 mg | ORAL_CAPSULE | Freq: Every day | ORAL | Status: DC
  Administered 2021-08-30: 0.4 mg via ORAL
  Filled 2021-08-30: qty 1

## 2021-08-30 MED ORDER — MENTHOL 3 MG MT LOZG: 1.0000 | LOZENGE | OROMUCOSAL | Status: DC | PRN

## 2021-08-30 MED ORDER — METOCLOPRAMIDE HCL 10 MG PO TABS
10.0000 mg | ORAL_TABLET | Freq: Three times a day (TID) | ORAL | Status: DC
  Administered 2021-08-30 – 2021-08-31 (×2): 10 mg via ORAL
  Filled 2021-08-30 (×6): qty 1

## 2021-08-30 MED ORDER — DEXMEDETOMIDINE (PRECEDEX) IN NS 20 MCG/5ML (4 MCG/ML) IV SYRINGE
PREFILLED_SYRINGE | INTRAVENOUS | Status: DC | PRN
  Administered 2021-08-30 (×3): 4 ug via INTRAVENOUS
  Administered 2021-08-30: 8 ug via INTRAVENOUS

## 2021-08-30 MED ORDER — CELECOXIB 200 MG PO CAPS: 400.0000 mg | ORAL_CAPSULE | Freq: Once | ORAL | Status: AC

## 2021-08-30 MED ORDER — MIDAZOLAM HCL 5 MG/5ML IJ SOLN
INTRAMUSCULAR | Status: DC | PRN
  Administered 2021-08-30: 2 mg via INTRAVENOUS

## 2021-08-30 MED ORDER — ACETAMINOPHEN 10 MG/ML IV SOLN
INTRAVENOUS | Status: DC | PRN
  Administered 2021-08-30: 1000 mg via INTRAVENOUS

## 2021-08-30 MED ORDER — PHENYLEPHRINE HCL-NACL 20-0.9 MG/250ML-% IV SOLN
INTRAVENOUS | Status: AC
  Filled 2021-08-30: qty 250

## 2021-08-30 MED ORDER — BUPIVACAINE LIPOSOME 1.3 % IJ SUSP
INTRAMUSCULAR | Status: AC
  Filled 2021-08-30: qty 20

## 2021-08-30 MED ORDER — ONDANSETRON HCL 4 MG/2ML IJ SOLN: 4.0000 mg | Freq: Once | INTRAMUSCULAR | Status: DC | PRN

## 2021-08-30 MED ORDER — OXYCODONE HCL 5 MG PO TABS: 5.0000 mg | ORAL_TABLET | ORAL | Status: DC | PRN

## 2021-08-30 MED ORDER — 0.9 % SODIUM CHLORIDE (POUR BTL) OPTIME
TOPICAL | Status: DC | PRN
Start: 2021-08-30 — End: 2021-08-30
  Administered 2021-08-30: 500 mL

## 2021-08-30 MED ORDER — TRANEXAMIC ACID-NACL 1000-0.7 MG/100ML-% IV SOLN: 1000.0000 mg | Freq: Once | INTRAVENOUS | Status: AC

## 2021-08-30 MED ORDER — OXYCODONE HCL 5 MG PO TABS: 5.0000 mg | ORAL_TABLET | Freq: Once | ORAL | Status: AC | PRN

## 2021-08-30 MED ORDER — ROSUVASTATIN CALCIUM 5 MG PO TABS
5.0000 mg | ORAL_TABLET | Freq: Every day | ORAL | Status: DC
  Administered 2021-08-30: 5 mg via ORAL
  Filled 2021-08-30: qty 1

## 2021-08-30 MED ORDER — CEFAZOLIN SODIUM-DEXTROSE 2-4 GM/100ML-% IV SOLN
2.0000 g | Freq: Three times a day (TID) | INTRAVENOUS | Status: AC
  Administered 2021-08-30 (×2): 2 g via INTRAVENOUS
  Filled 2021-08-30 (×2): qty 100

## 2021-08-30 MED ORDER — TRANEXAMIC ACID-NACL 1000-0.7 MG/100ML-% IV SOLN
1000.0000 mg | INTRAVENOUS | Status: AC
  Administered 2021-08-30: 1000 mg via INTRAVENOUS

## 2021-08-30 MED ORDER — SODIUM CHLORIDE (PF) 0.9 % IJ SOLN
INTRAMUSCULAR | Status: DC | PRN
  Administered 2021-08-30: 120 mL via INTRAMUSCULAR

## 2021-08-30 MED ORDER — FENTANYL CITRATE (PF) 100 MCG/2ML IJ SOLN
INTRAMUSCULAR | Status: DC | PRN
  Administered 2021-08-30 (×4): 25 ug via INTRAVENOUS

## 2021-08-30 MED ORDER — BISACODYL 10 MG RE SUPP: 10.0000 mg | Freq: Every day | RECTAL | Status: DC | PRN

## 2021-08-30 MED ORDER — DEXAMETHASONE SODIUM PHOSPHATE 10 MG/ML IJ SOLN
INTRAMUSCULAR | Status: AC
  Administered 2021-08-30: 8 mg via INTRAVENOUS
  Filled 2021-08-30: qty 1

## 2021-08-30 SURGICAL SUPPLY — 75 items
ATTUNE MED DOME PAT 41 KNEE (Knees) ×1 IMPLANT
ATTUNE PS FEM RT SZ9 CEM KNEE (Femur) ×1 IMPLANT
ATTUNE PS RP KNEE INSR SZ9 7 (Insert) ×1 IMPLANT
BASE TIBIAL ATTUNE KNEE SZ9 (Knees) IMPLANT
BATTERY INSTRU NAVIGATION (MISCELLANEOUS) ×8 IMPLANT
BLADE CLIPPER SURG (BLADE) ×1 IMPLANT
BLADE SAW 70X12.5 (BLADE) ×2 IMPLANT
BLADE SAW 90X13X1.19 OSCILLAT (BLADE) ×2 IMPLANT
BLADE SAW 90X25X1.19 OSCILLAT (BLADE) ×2 IMPLANT
BONE CEMENT GENTAMICIN (Cement) ×4 IMPLANT
CEMENT BONE GENTAMICIN 40 (Cement) IMPLANT
CEMENT HV SMART SET (Cement) IMPLANT
COOLER POLAR GLACIER W/PUMP (MISCELLANEOUS) ×2 IMPLANT
CUFF TOURN SGL QUICK 24 (TOURNIQUET CUFF)
CUFF TOURN SGL QUICK 34 (TOURNIQUET CUFF)
CUFF TRNQT CYL 24X4X16.5-23 (TOURNIQUET CUFF) IMPLANT
CUFF TRNQT CYL 34X4.125X (TOURNIQUET CUFF) IMPLANT
DRAPE 3/4 80X56 (DRAPES) ×2 IMPLANT
DRAPE INCISE IOBAN 66X45 STRL (DRAPES) IMPLANT
DRSG DERMACEA 8X12 NADH (GAUZE/BANDAGES/DRESSINGS) ×2 IMPLANT
DRSG MEPILEX SACRM 8.7X9.8 (GAUZE/BANDAGES/DRESSINGS) ×2 IMPLANT
DRSG OPSITE POSTOP 4X14 (GAUZE/BANDAGES/DRESSINGS) ×2 IMPLANT
DRSG TEGADERM 4X4.75 (GAUZE/BANDAGES/DRESSINGS) ×2 IMPLANT
DURAPREP 26ML APPLICATOR (WOUND CARE) ×4 IMPLANT
ELECT CAUTERY BLADE 6.4 (BLADE) ×2 IMPLANT
ELECT REM PT RETURN 9FT ADLT (ELECTROSURGICAL) ×2
ELECTRODE REM PT RTRN 9FT ADLT (ELECTROSURGICAL) ×1 IMPLANT
EX-PIN ORTHOLOCK NAV 4X150 (PIN) ×4 IMPLANT
GLOVE BIOGEL M STRL SZ7.5 (GLOVE) ×8 IMPLANT
GLOVE BIOGEL PI IND STRL 8 (GLOVE) ×1 IMPLANT
GLOVE BIOGEL PI INDICATOR 8 (GLOVE) ×1
GLOVE SURG UNDER POLY LF SZ7.5 (GLOVE) ×2 IMPLANT
GOWN STRL REUS W/ TWL LRG LVL3 (GOWN DISPOSABLE) ×2 IMPLANT
GOWN STRL REUS W/ TWL XL LVL3 (GOWN DISPOSABLE) ×1 IMPLANT
GOWN STRL REUS W/TWL LRG LVL3 (GOWN DISPOSABLE) ×2
GOWN STRL REUS W/TWL XL LVL3 (GOWN DISPOSABLE) ×1
HEMOVAC 400CC 10FR (MISCELLANEOUS) ×2 IMPLANT
HOLDER FOLEY CATH W/STRAP (MISCELLANEOUS) ×2 IMPLANT
HOLSTER ELECTROSUGICAL PENCIL (MISCELLANEOUS) ×2 IMPLANT
HOOD PEEL AWAY FLYTE STAYCOOL (MISCELLANEOUS) ×4 IMPLANT
IV NS IRRIG 3000ML ARTHROMATIC (IV SOLUTION) ×2 IMPLANT
KIT TURNOVER KIT A (KITS) ×2 IMPLANT
KNIFE SCULPS 14X20 (INSTRUMENTS) ×2 IMPLANT
MANIFOLD NEPTUNE II (INSTRUMENTS) ×4 IMPLANT
NDL SPNL 20GX3.5 QUINCKE YW (NEEDLE) ×2 IMPLANT
NEEDLE SPNL 20GX3.5 QUINCKE YW (NEEDLE) ×4 IMPLANT
NS IRRIG 500ML POUR BTL (IV SOLUTION) ×2 IMPLANT
PACK TOTAL KNEE (MISCELLANEOUS) ×2 IMPLANT
PAD ABD DERMACEA PRESS 5X9 (GAUZE/BANDAGES/DRESSINGS) ×4 IMPLANT
PAD WRAPON POLAR KNEE (MISCELLANEOUS) ×1 IMPLANT
PIN DRILL FIX HALF THREAD (BIT) ×4 IMPLANT
PIN DRILL QUICK PACK ×2 IMPLANT
PIN FIXATION 1/8DIA X 3INL (PIN) ×2 IMPLANT
PULSAVAC PLUS IRRIG FAN TIP (DISPOSABLE) ×2
SOL PREP PVP 2OZ (MISCELLANEOUS) ×2
SOLUTION IRRIG SURGIPHOR (IV SOLUTION) ×2 IMPLANT
SOLUTION PREP PVP 2OZ (MISCELLANEOUS) ×1 IMPLANT
SPONGE DRAIN TRACH 4X4 STRL 2S (GAUZE/BANDAGES/DRESSINGS) ×2 IMPLANT
STAPLER SKIN PROX 35W (STAPLE) ×2 IMPLANT
STOCKINETTE IMPERV 14X48 (MISCELLANEOUS) IMPLANT
STRAP TIBIA SHORT (MISCELLANEOUS) ×2 IMPLANT
SUCTION FRAZIER HANDLE 10FR (MISCELLANEOUS) ×1
SUCTION TUBE FRAZIER 10FR DISP (MISCELLANEOUS) ×1 IMPLANT
SUT VIC AB 0 CT1 36 (SUTURE) ×4 IMPLANT
SUT VIC AB 1 CT1 36 (SUTURE) ×4 IMPLANT
SUT VIC AB 2-0 CT2 27 (SUTURE) ×2 IMPLANT
SYR 30ML LL (SYRINGE) ×4 IMPLANT
TIBIAL BASE ATTUNE KNEE SZ9 (Knees) ×2 IMPLANT
TIP FAN IRRIG PULSAVAC PLUS (DISPOSABLE) ×1 IMPLANT
TOWEL OR 17X26 4PK STRL BLUE (TOWEL DISPOSABLE) IMPLANT
TOWER CARTRIDGE SMART MIX (DISPOSABLE) ×2 IMPLANT
TRAY FOLEY MTR SLVR 16FR STAT (SET/KITS/TRAYS/PACK) ×2 IMPLANT
WATER STERILE IRR 1000ML POUR (IV SOLUTION) ×1 IMPLANT
WATER STERILE IRR 500ML POUR (IV SOLUTION) ×1 IMPLANT
WRAPON POLAR PAD KNEE (MISCELLANEOUS) ×2

## 2021-08-30 NOTE — Anesthesia Procedure Notes (Signed)
Spinal  Patient location during procedure: OR Start time: 08/30/2021 7:20 AM End time: 08/30/2021 7:24 AM Reason for block: surgical anesthesia Staffing Performed: resident/CRNA  Anesthesiologist: Darrin Nipper, MD Resident/CRNA: Hedda Slade, CRNA Preanesthetic Checklist Completed: patient identified, IV checked, site marked, risks and benefits discussed, surgical consent, monitors and equipment checked, pre-op evaluation and timeout performed Spinal Block Patient position: sitting Prep: ChloraPrep Patient monitoring: heart rate, continuous pulse ox, blood pressure and cardiac monitor Approach: midline Location: L3-4 Injection technique: single-shot Needle Needle type: Whitacre and Introducer  Needle gauge: 24 G Needle length: 9 cm Assessment Events: CSF return Additional Notes Sterile aseptic technique used throughout the procedure.  Negative paresthesia. Negative blood return. Positive free-flowing CSF. Expiration date of kit checked and confirmed. Patient tolerated procedure well, without complications.

## 2021-08-30 NOTE — Anesthesia Preprocedure Evaluation (Addendum)
Anesthesia Evaluation  Patient identified by MRN, date of birth, ID band Patient awake    Reviewed: Allergy & Precautions, NPO status , Patient's Chart, lab work & pertinent test results  History of Anesthesia Complications Negative for: history of anesthetic complications  Airway Mallampati: III   Neck ROM: Full    Dental  (+) Missing   Pulmonary sleep apnea and Continuous Positive Airway Pressure Ventilation ,    Pulmonary exam normal breath sounds clear to auscultation       Cardiovascular hypertension, Normal cardiovascular exam Rhythm:Regular Rate:Normal  ECG 08/21/21:  Normal sinus rhythm 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 - There is a small in size, mild in severity, apical anterior and mid anteroseptal defect most consistent with RV insertion. - No significant ischemia is observed. - The left ventricular ejection fraction is normal (54% by QGS, 59% by Siemens). - No significant coronary artery calcification is observed on the attenuation correction CT. Mild aortic atherosclerosis and aortic valvular calcification are incidentally noted.   Neuro/Psych negative neurological ROS     GI/Hepatic negative GI ROS,   Endo/Other  diabetes, Type 2Obesity   Renal/GU Renal disease (nephrolithiasis)     Musculoskeletal  (+) Arthritis ,   Abdominal   Peds  Hematology negative hematology ROS (+)   Anesthesia Other Findings   Reproductive/Obstetrics                            Anesthesia Physical Anesthesia  Plan  ASA: 2  Anesthesia Plan: General and Spinal   Post-op Pain Management:    Induction: Intravenous  PONV Risk Score and Plan: 2 and Propofol infusion, TIVA, Treatment may vary due to age or medical condition and Ondansetron  Airway Management Planned: Natural Airway and Nasal Cannula  Additional Equipment:   Intra-op Plan:   Post-operative Plan:   Informed Consent: I have reviewed the patients History and Physical, chart, labs and discussed the procedure including the risks, benefits and alternatives for the proposed anesthesia with the patient or authorized representative who has indicated his/her understanding and acceptance.       Plan Discussed with: CRNA  Anesthesia Plan Comments: (Plan for spinal and GA with natural airway, LMA/GETA backup.  Patient consented for risks of anesthesia including but not limited to:  - adverse reactions to medications - damage to eyes, teeth, lips or other oral mucosa - nerve damage due to positioning  - sore throat or hoarseness - headache, bleeding, infection, nerve damage 2/2 spinal - damage to heart, brain, nerves, lungs, other parts of body or loss of life  Informed patient about role of CRNA in peri- and intra-operative care.  Patient voiced understanding.)       Anesthesia Quick Evaluation

## 2021-08-30 NOTE — Progress Notes (Signed)
Patient awake, states "I'm trying to wake up" Able to wiggle bil lower ext, bend knee's, sensation intact. Verbalizes understanding of procedure completed and plan of care .

## 2021-08-30 NOTE — Discharge Summary (Signed)
Physician Discharge Summary  Patient ID: Mitchell Case MRN: [id number] DOB/AGE: 67-Jul-1957 67 y.o.  Admit date: 08/30/2021 Discharge date: 08/31/2021  Admission Diagnoses:  Total knee replacement status [Z96.659]  Surgeries:Procedure(s): Right total knee arthroplasty using computer-assisted navigation   SURGEON:  Jena GaussJames P Hooten, Jr. M.D.   ANESTHESIA: spinal   ESTIMATED BLOOD LOSS: 50 mL   FLUIDS REPLACED: 1000 mL of crystalloid   TOURNIQUET TIME: 99 minutes   DRAINS: 2 medium Hemovac drains   SOFT TISSUE RELEASES: Anterior cruciate ligament, posterior cruciate ligament, deep medial collateral ligament, patellofemoral ligament   IMPLANTS UTILIZED: DePuy Attune size 9 posterior stabilized femoral component (cemented), size 9 rotating platform tibial component (cemented), 41 mm medialized dome patella (cemented), and a 7 mm stabilized rotating platform polyethylene insert.  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), bilateral 07/26/2020   Kidney stone 07/26/2020   Sleep apnea 07/26/2020   Primary osteoarthritis of right knee 06/24/2020   Type 2 diabetes mellitus without complication, without long-term current use of insulin (HCC) 02/26/2016   Essential hypertension 07/17/2014   Pure hypercholesterolemia 07/17/2014   Iron deficiency anemia due to chronic blood loss 04/10/2014   Lower GI bleed 01/02/2014   Difficult intubation 11/11/2012    Past Medical History:  Diagnosis Date   Arthritis    Diabetes mellitus without complication (HCC)     Family history of adverse reaction to anesthesia    PONV in mother   GI bleed    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 male who was admitted 08/30/2021 with a diagnosis of right knee osteoarthritis and went to the operating room on 08/30/2021 and underwent right total knee arthroplasty. The patient received perioperative antibiotics for prophylaxis (see below). The patient tolerated the procedure well and was transported to PACU in stable condition. After meeting PACU criteria, the patient was subsequently transferred to the Orthopaedics/Rehabilitation unit.   The patient received DVT prophylaxis in the form of early mobilization, Lovenox, TED hose, and SCDs . A sacral pad had been placed and heels were elevated off of the bed with rolled towels in order to protect skin integrity. Foley catheter was discontinued on postoperative day #0. Wound drains were discontinued on postoperative day #1. The surgical incision was healing well without signs of infection.  Physical therapy was initiated postoperatively for transfers, gait training, and strengthening. Occupational therapy was initiated for activities of daily living and evaluation for assisted devices. Rehabilitation goals were reviewed in detail with the patient. The patient made steady progress with physical therapy and physical therapy recommended discharge to Home.   The patient achieved the preliminary goals of this hospitalization and was felt to be medically and orthopaedically appropriate for discharge.  He was given perioperative antibiotics:  Anti-infectives (From admission,  onward)    Start     Dose/Rate Route Frequency Ordered Stop   08/30/21 1500  ceFAZolin (ANCEF) IVPB 2g/100 mL premix         2 g 200 mL/hr over 30 Minutes Intravenous Every 8 hours 08/30/21 1329 08/30/21 2321   08/30/21 0621  ceFAZolin (ANCEF) 2-4 GM/100ML-% IVPB       Note to Pharmacy: Christene Slates W: cabinet override      08/30/21 0621 08/30/21 0743   08/30/21 0600  ceFAZolin (ANCEF) IVPB 2g/100 mL premix        2 g 200 mL/hr over 30 Minutes Intravenous On call to O.R. 08/30/21 [id number] 08/30/21 0748     .  Recent vital signs:  Vitals:   08/31/21 0538 08/31/21 0747  BP: 120/83 120/80  Pulse: 70 63  Resp: 17 18  Temp: 97.8 F (36.6 C) 97.7 F (36.5 C)  SpO2: 97% 98%    Recent laboratory studies:  No results for input(s): WBC, HGB, HCT, PLT, K, CL, CO2, BUN, CREATININE, GLUCOSE, CALCIUM, LABPT, INR in the last 72 hours.  Diagnostic Studies: DG Knee Right Port  Result Date: 08/30/2021 CLINICAL DATA:  Total knee replacement EXAM: PORTABLE RIGHT KNEE - 1-2 VIEW COMPARISON:  None Available. FINDINGS: Right knee demonstrates a total knee arthroplasty without evidence of hardware failure complication. No significant joint effusion. No fracture or dislocation. Alignment is anatomic. Surgical drains are present. Post-surgical changes noted in the surrounding soft tissues. IMPRESSION: 1. Interval right total knee arthroplasty. Electronically Signed   By: Elige Ko M.D.   On: 08/30/2021 12:00    Discharge Medications:   Allergies as of 08/31/2021       Reactions   Penicillins Swelling   IgE = 29 (WNL) on 07/13/2020 Has patient had a PCN reaction causing immediate rash, facial/tongue/throat swelling, SOB or lightheadedness with hypotension: No Has patient had a PCN reaction causing severe rash involving mucus membranes or skin necrosis: No Has patient had a PCN reaction that required hospitalization No Has patient had a PCN reaction occurring within the last 10 years: No If all of the above answers are "NO", then may proceed with Cephalosporin use.   Sulfa Antibiotics Itching   Atorvastatin    Muscle  Pain   Hydrochlorothiazide Itching        Medication List     STOP taking these medications    TURMERIC PO       TAKE these medications    aspirin EC 81 MG tablet Take 81 mg by mouth daily. Swallow whole.   celecoxib 200 MG capsule Commonly known as: CELEBREX Take 1 capsule (200 mg total) by mouth 2 (two) times daily.   diazepam 5 MG tablet Commonly known as: VALIUM Take 5 mg by mouth every 6 (six) hours as needed for anxiety. For Vertigo   enoxaparin 40 MG/0.4ML injection Commonly known as: LOVENOX Inject 0.4 mLs (40 mg total) into the skin daily for 14 days.   fluticasone 50 MCG/ACT nasal spray Commonly known as: FLONASE Place 1 spray into both nostrils daily as needed for allergies or rhinitis.   liraglutide 18 MG/3ML Sopn Commonly known as: VICTOZA Inject 0.6 mg into the skin daily.   lisinopril 10 MG tablet Commonly known as: ZESTRIL Take 10 mg by mouth at bedtime.   meclizine 12.5 MG tablet Commonly known as: ANTIVERT Take 12.5 mg by mouth 3 (three) times daily as needed for dizziness.   metFORMIN 1000 MG tablet Commonly known as: GLUCOPHAGE Take  1,000 mg by mouth 2 (two) times daily with a meal.   OCUVITE PO Take 1 tablet by mouth daily.   oxyCODONE 5 MG immediate release tablet Commonly known as: Oxy IR/ROXICODONE Take 1 tablet (5 mg total) by mouth every 4 (four) hours as needed for severe pain.   rosuvastatin 5 MG tablet Commonly known as: CRESTOR Take 5 mg by mouth at bedtime.   tamsulosin 0.4 MG Caps capsule Commonly known as: FLOMAX Take 0.4 mg by mouth at bedtime.   traMADol 50 MG tablet Commonly known as: ULTRAM Take 1 tablet (50 mg total) by mouth every 4 (four) hours as needed for moderate pain.               Durable Medical Equipment  (From admission, onward)           Start     Ordered   08/30/21 1330  DME Walker rolling  Once       Question:  Patient needs a walker to treat with the following condition   Answer:  Total knee replacement status   08/30/21 1329   08/30/21 1330  DME Bedside commode  Once       Question:  Patient needs a bedside commode to treat with the following condition  Answer:  Total knee replacement status   08/30/21 1329            Disposition: Home with home health PT     Follow-up Information     Madelyn Flavors, PA-C Follow up on 09/13/2021.   Specialty: Orthopedic Surgery Why: at 9:45am Contact information: [address] [facility] [place] [address] [phone]         Donato Heinz, MD Follow up on 10/22/2021.   Specialty: Orthopedic Surgery Why: at 10:00am Contact information: [address] [facility] [address] [facility] [place] [address] [phone]                  [name], PA-C 08/31/2021, 10:20 AM

## 2021-08-30 NOTE — Transfer of Care (Signed)
Immediate Anesthesia Transfer of Care Note  Patient: Mitchell Case  Procedure(s) Performed: COMPUTER ASSISTED TOTAL KNEE ARTHROPLASTY, RNFA (Right: Knee)  Patient Location: PACU  Anesthesia Type:General  Level of Consciousness: awake, alert  and oriented  Airway & Oxygen Therapy: Patient Spontanous Breathing  Post-op Assessment: Report given to RN and Post -op Vital signs reviewed and stable  Post vital signs: Reviewed and stable  Last Vitals:  Vitals Value Taken Time  BP 124/77 08/30/21 1136  Temp    Pulse 84 08/30/21 1141  Resp 8 08/30/21 1141  SpO2 94 % 08/30/21 1141  Vitals shown include unvalidated device data.  Last Pain:  Vitals:   08/30/21 0612  TempSrc: Oral  PainSc: 4          Complications: No notable events documented.

## 2021-08-30 NOTE — Evaluation (Signed)
Physical Therapy Evaluation Patient Details Name: Mitchell Case MRN: [id number] DOB: November 02, 1955 Today's Date: 08/30/2021  History of Present Illness  Pt admitted for R TKR. HIstory includes HTN, HLD, DM and recent L TKR. Pt is POD 0 at time of evaluation.  Clinical Impression  Pt is a pleasant 66 year old 70 who was admitted for R TKR. Pt performs bed mobility with mod I, transfers with cga, and ambulation with cga and RW. Pt demonstrates deficits with strength/mobility. Pt is very motivated to participate and eager to get back to baseline. Pt was very active and working out in gym PTA. Would benefit from skilled PT to address above deficits and promote optimal return to PLOF. Pt demonstrates ability to perform 10 SLRs with independence, therefore does not require KI for mobility. Recommend transition to HHPT upon discharge from acute hospitalization.      Recommendations for follow up therapy are one component of a multi-disciplinary discharge planning process, led by the attending physician.  Recommendations may be updated based on patient status, additional functional criteria and insurance authorization.  Follow Up Recommendations Home health PT    Assistance Recommended at Discharge Set up Supervision/Assistance  Patient can return home with the following  Help with stairs or ramp for entrance    Equipment Recommendations None recommended by PT  Recommendations for Other Services       Functional Status Assessment Patient has had a recent decline in their functional status and demonstrates the ability to make significant improvements in function in a reasonable and predictable amount of time.     Precautions / Restrictions Precautions Precautions: Fall;Knee Precaution Booklet Issued: No Restrictions Weight Bearing Restrictions: Yes RLE Weight Bearing: Weight bearing as tolerated      Mobility  Bed Mobility Overal bed mobility: Modified Independent              General bed mobility comments: safe technique with ease of mobility. Upright posture    Transfers Overall transfer level: Needs assistance Equipment used: Rolling walker (2 wheels) Transfers: Sit to/from Stand Sit to Stand: Min guard           General transfer comment: safe technique with upright posture. Bed slightly elevated due to pt height. Once standing, good weight acceptance    Ambulation/Gait Ambulation/Gait assistance: Min guard Gait Distance (Feet): 220 Feet Assistive device: Rolling walker (2 wheels) Gait Pattern/deviations: Step-through pattern       General Gait Details: ambulated with reciprocal gait pattern and safe technique. UPright posture with RW. No fatigue  Stairs            Wheelchair Mobility    Modified Rankin (Stroke Patients Only)       Balance Overall balance assessment: Needs assistance Sitting-balance support: Feet supported, Bilateral upper extremity supported Sitting balance-Leahy Scale: Good     Standing balance support: Bilateral upper extremity supported Standing balance-Leahy Scale: Good                               Pertinent Vitals/Pain Pain Assessment Pain Assessment: 0-10 Pain Score: 0-No pain Pain Location: R LE Pain Descriptors / Indicators: Operative site guarding Pain Intervention(s): Limited activity within patient's tolerance, Ice applied    Home Living Family/patient expects to be discharged to:: Private residence Living Arrangements: Spouse/significant other Available Help at Discharge: Family;Available 24 hours/day Type of Home: House Home Access: Stairs to enter Entrance Stairs-Rails: Can reach both Entrance Stairs-Number of Steps: 4  Home Layout: One level Home Equipment: Agricultural consultant (2 wheels);BSC/3in1      Prior Function Prior Level of Function : Independent/Modified Independent             Mobility Comments: indep and active. retired Programmer, applications        Extremity/Trunk Assessment   Upper Extremity Assessment Upper Extremity Assessment: Overall WFL for tasks assessed    Lower Extremity Assessment Lower Extremity Assessment: Generalized weakness (R LE grossly 4/5; L LE grossly 5/5)       Communication   Communication: No difficulties  Cognition Arousal/Alertness: Awake/alert Behavior During Therapy: WFL for tasks assessed/performed Overall Cognitive Status: Within Functional Limits for tasks assessed                                          General Comments      Exercises Total Joint Exercises Goniometric ROM: R knee AAROM: 0-102 degrees Other Exercises Other Exercises: Supine ther-ex performed on R LE including AP, quad sets, SLRs, and hip abd/add. Safe technique with upright posture noted   Assessment/Plan    PT Assessment Patient needs continued PT services  PT Problem List Decreased strength;Decreased range of motion;Decreased balance;Decreased mobility;Decreased knowledge of use of DME;Pain       PT Treatment Interventions DME instruction;Gait training;Therapeutic exercise    PT Goals (Current goals can be found in the Care Plan section)  Acute Rehab PT Goals Patient Stated Goal: to go home PT Goal Formulation: With patient Time For Goal Achievement: 09/13/21 Potential to Achieve Goals: Good    Frequency BID     Co-evaluation               AM-PAC PT "6 Clicks" Mobility  Outcome Measure Help needed turning from your back to your side while in a flat bed without using bedrails?: None Help needed moving from lying on your back to sitting on the side of a flat bed without using bedrails?: None Help needed moving to and from a bed to a chair (including a wheelchair)?: A Little Help needed standing up from a chair using your arms (e.g., wheelchair or bedside chair)?: A Little Help needed to walk in hospital room?: A Little Help needed climbing 3-5 steps with a railing?  : A Little 6 Click Score: 20    End of Session Equipment Utilized During Treatment: Gait belt Activity Tolerance: Patient tolerated treatment well Patient left: in chair;with chair alarm set Nurse Communication: Mobility status PT Visit Diagnosis: Muscle weakness (generalized) (M62.81);Difficulty in walking, not elsewhere classified (R26.2);Pain Pain - Right/Left: Right Pain - part of body: Knee    Time: 1448-1856 PT Time Calculation (min) (ACUTE ONLY): 29 min   Charges:   PT Evaluation $PT Eval Low Complexity: 1 Low PT Treatments $Therapeutic Exercise: 8-22 mins        Elizabeth Palau, PT, DPT, GCS [phone]   Kyrin Garn 08/30/2021, 4:36 PM

## 2021-08-30 NOTE — Op Note (Signed)
OPERATIVE NOTE  DATE OF SURGERY:  08/30/2021  PATIENT NAME:  Mitchell Case   DOB: Aug 31, 1955  MRN: [id number]  PRE-OPERATIVE DIAGNOSIS: Degenerative arthrosis of the right knee, primary  POST-OPERATIVE DIAGNOSIS:  Same  PROCEDURE:  Right total knee arthroplasty using computer-assisted navigation  SURGEON:  Jena Gauss. M.D.  ANESTHESIA: spinal  ESTIMATED BLOOD LOSS: 50 mL  FLUIDS REPLACED: 1000 mL of crystalloid  TOURNIQUET TIME: 99 minutes  DRAINS: 2 medium Hemovac drains  SOFT TISSUE RELEASES: Anterior cruciate ligament, posterior cruciate ligament, deep medial collateral ligament, patellofemoral ligament  IMPLANTS UTILIZED: DePuy Attune size 9 posterior stabilized femoral component (cemented), size 9 rotating platform tibial component (cemented), 41 mm medialized dome patella (cemented), and a 7 mm stabilized rotating platform polyethylene insert.  INDICATIONS FOR SURGERY: Mitchell Case is a 66 y.o. year old male with a long history of progressive knee pain. X-rays demonstrated severe degenerative changes in tricompartmental fashion. The patient had not seen any significant improvement despite conservative nonsurgical intervention. After discussion of the risks and benefits of surgical intervention, the patient expressed understanding of the risks benefits and agree with plans for total knee arthroplasty.   The risks, benefits, and alternatives were discussed at length including but not limited to the risks of infection, bleeding, nerve injury, stiffness, blood clots, the need for revision surgery, cardiopulmonary complications, among others, and they were willing to proceed.  PROCEDURE IN DETAIL: The patient was brought into the operating room and, after adequate spinal anesthesia was achieved, a tourniquet was placed on the patient's upper thigh. The patient's knee and leg were cleaned and prepped with alcohol and DuraPrep and draped in the usual sterile fashion. A "timeout"  was performed as per usual protocol. The lower extremity was exsanguinated using an Esmarch, and the tourniquet was inflated to 300 mmHg. An anterior longitudinal incision was made followed by a standard mid vastus approach. The deep fibers of the medial collateral ligament were elevated in a subperiosteal fashion off of the medial flare of the tibia so as to maintain a continuous soft tissue sleeve. The patella was subluxed laterally and the patellofemoral ligament was incised. Inspection of the knee demonstrated severe degenerative changes with full-thickness loss of articular cartilage. Osteophytes were debrided using a rongeur. Anterior and posterior cruciate ligaments were excised. Two 4.0 mm Schanz pins were inserted in the femur and into the tibia for attachment of the array of trackers used for computer-assisted navigation. Hip center was identified using a circumduction technique. Distal landmarks were mapped using the computer. The distal femur and proximal tibia were mapped using the computer. The distal femoral cutting guide was positioned using computer-assisted navigation so as to achieve a 5 distal valgus cut. The femur was sized and it was felt that a size 9 femoral component was appropriate. A size 9 femoral cutting guide was positioned and the anterior cut was performed and verified using the computer. This was followed by completion of the posterior and chamfer cuts. Femoral cutting guide for the central box was then positioned in the center box cut was performed.  Attention was then directed to the proximal tibia. Medial and lateral menisci were excised. The extramedullary tibial cutting guide was positioned using computer-assisted navigation so as to achieve a 0 varus-valgus alignment and 3 posterior slope. The cut was performed and verified using the computer. The proximal tibia was sized and it was felt that a size 9 tibial tray was appropriate. Tibial and femoral trials were inserted  followed  by insertion of a 7 mm polyethylene insert. This allowed for excellent mediolateral soft tissue balancing both in flexion and in full extension. Finally, the patella was cut and prepared so as to accommodate a 41 mm medialized dome patella. A patella trial was placed and the knee was placed through a range of motion with excellent patellar tracking appreciated. The femoral trial was removed after debridement of posterior osteophytes. The central post-hole for the tibial component was reamed followed by insertion of a keel punch. Tibial trials were then removed. Cut surfaces of bone were irrigated with copious amounts of normal saline using pulsatile lavage and then suctioned dry. Polymethylmethacrylate cement with gentamicin was prepared in the usual fashion using a vacuum mixer. Cement was applied to the cut surface of the proximal tibia as well as along the undersurface of a size 9 rotating platform tibial component. Tibial component was positioned and impacted into place. Excess cement was removed using Personal assistant. Cement was then applied to the cut surfaces of the femur as well as along the posterior flanges of the size 9 femoral component. The femoral component was positioned and impacted into place. Excess cement was removed using Personal assistant. A 7 mm polyethylene trial was inserted and the knee was brought into full extension with steady axial compression applied. Finally, cement was applied to the backside of a 41 mm medialized dome patella and the patellar component was positioned and patellar clamp applied. Excess cement was removed using Personal assistant. After adequate curing of the cement, the tourniquet was deflated after a total tourniquet time of 99 minutes. Hemostasis was achieved using electrocautery. The knee was irrigated with copious amounts of normal saline using pulsatile lavage followed by 450 ml of Surgiphor and then suctioned dry. 20 mL of 1.3% Exparel and 60 mL of 0.25%  Marcaine in 40 mL of normal saline was injected along the posterior capsule, medial and lateral gutters, and along the arthrotomy site. A 7 mm stabilized rotating platform polyethylene insert was inserted and the knee was placed through a range of motion with excellent mediolateral soft tissue balancing appreciated and excellent patellar tracking noted. 2 medium drains were placed in the wound bed and brought out through separate stab incisions. The medial parapatellar portion of the incision was reapproximated using interrupted sutures of #1 Vicryl. Subcutaneous tissue was approximated in layers using first #0 Vicryl followed #2-0 Vicryl. The skin was approximated with skin staples. A sterile dressing was applied.  The patient tolerated the procedure well and was transported to the recovery room in stable condition.    Chayden Garrelts P. Angie Fava., M.D.

## 2021-08-30 NOTE — Anesthesia Postprocedure Evaluation (Signed)
Anesthesia Post Note  Patient: Mitchell Case  Procedure(s) Performed: COMPUTER ASSISTED TOTAL KNEE ARTHROPLASTY, RNFA (Right: Knee)  Patient location during evaluation: PACU Anesthesia Type: Spinal Level of consciousness: awake and alert, oriented and patient cooperative Pain management: pain level controlled Vital Signs Assessment: post-procedure vital signs reviewed and stable Respiratory status: spontaneous breathing, nonlabored ventilation and respiratory function stable Cardiovascular status: blood pressure returned to baseline and stable Postop Assessment: adequate PO intake, no headache and spinal receding Anesthetic complications: no   No notable events documented.   Last Vitals:  Vitals:   08/30/21 1221 08/30/21 1230  BP:  127/85  Pulse: 77   Resp: 15 18  Temp:    SpO2: 97%     Last Pain:  Vitals:   08/30/21 1215  TempSrc:   PainSc: 0-No pain                 Reed Breech

## 2021-08-30 NOTE — H&P (Signed)
The patient has been re-examined, and the chart reviewed, and there have been no interval changes to the documented history and physical.    The risks, benefits, and alternatives have been discussed at length. The patient expressed understanding of the risks benefits and agreed with plans for surgical intervention.  Antron Seth P. Jahmeek Shirk, Jr. M.D.    

## 2021-08-31 DIAGNOSIS — M1711 Unilateral primary osteoarthritis, right knee: Secondary | ICD-10-CM | POA: Diagnosis not present

## 2021-08-31 LAB — GLUCOSE, CAPILLARY: Glucose-Capillary: 156 mg/dL — ABNORMAL HIGH (ref 70–99)

## 2021-08-31 MED ORDER — CELECOXIB 200 MG PO CAPS
200.0000 mg | ORAL_CAPSULE | Freq: Two times a day (BID) | ORAL | 0 refills | Status: DC
Start: 2021-08-31 — End: 2022-06-02

## 2021-08-31 MED ORDER — TRAMADOL HCL 50 MG PO TABS: 50.0000 mg | ORAL_TABLET | ORAL | 0 refills | Status: DC | PRN

## 2021-08-31 MED ORDER — ENOXAPARIN SODIUM 40 MG/0.4ML IJ SOSY: 40.0000 mg | PREFILLED_SYRINGE | INTRAMUSCULAR | 0 refills | Status: DC

## 2021-08-31 MED ORDER — OXYCODONE HCL 5 MG PO TABS: 5.0000 mg | ORAL_TABLET | ORAL | 0 refills | Status: DC | PRN

## 2021-08-31 NOTE — TOC Transition Note (Signed)
Transition of Care [facility]) - CM/SW Discharge Note   Patient Details  Name: Mitchell Case MRN: [id number] Date of Birth: [birth year]  Transition of Care [facility]) CM/SW Contact:  Verna Czech [place], [place] Phone Number: 08/31/2021, 11:18 AM   Clinical Narrative:     Patient to discharge home today with [facility] PT previously arranged with [facility]. Spoke to patient's spouse to confirm DME needs. Patient already has a rolling walker and does not need a bedside commode.  [address] LCSW Transition of Care [phone]         Patient Goals and CMS Choice        Discharge Placement                       Discharge Plan and Services                                     Social Determinants of Health (SDOH) Interventions     Readmission Risk Interventions     View : No data to display.

## 2021-08-31 NOTE — Evaluation (Addendum)
Occupational Therapy Evaluation Patient Details Name: Mitchell Case MRN: [id number] DOB: Nov 07, 1955 Today's Date: 08/31/2021   History of Present Illness Pt is a 66 y.o. male who was admitted for a R TKR. History includes: HTN, HLD, DM and recent L TKR.   Clinical Impression   Pt. was admitted for a Right TKR. Pt. Has had a Left TKR one year ago. Pt. Resides at home with his wife. Pt. Was independent with all ADLs, and IADL functioning: including meal preparation, and medication management. Pt. is retired, and continues to be able to drive. Pt. Was up in the recliner chair, and smiling upon arrival this morning. Pt. reports 2/10 pain. Pt./caregiver reports the pt. Was  able to complete morning ADL care sitting at the EOB, and standing at the sink this morning with Modified independence, and transferred to the chair with Modified independence. Pt. was provided with a pair gripper socks per request. Reviewed anticipated needs at home upon discharge with the pt. And his wife. Pt. plans to return home with his wife to assist as needed. No further OT services are warranted at this time. Pt. Is in agreement.    Recommendations for follow up therapy are one component of a multi-disciplinary discharge planning process, led by the attending physician.  Recommendations may be updated based on patient status, additional functional criteria and insurance authorization.   Follow Up Recommendations  No OT follow up    Assistance Recommended at Discharge    Patient can return home with the following A little help with bathing/dressing/bathroom;Assistance with cooking/housework    Functional Status Assessment  Patient has had a recent decline in their functional status and demonstrates the ability to make significant improvements in function in a reasonable and predictable amount of time.  Equipment Recommendations       Recommendations for Other Services       Precautions / Restrictions  Precautions Precautions: Fall;Knee Restrictions Weight Bearing Restrictions: No RLE Weight Bearing: Weight bearing as tolerated      Mobility Bed Mobility               General bed mobility comments: Pt. up in chair upon arrival    Transfers Overall transfer level: Needs assistance Equipment used: Rolling walker (2 wheels) Transfers: Sit to/from Stand Sit to Stand: Modified independent (Device/Increase time)                  Balance                                           ADL either performed or assessed with clinical judgement   ADL   Eating/Feeding: Independent;Set up   Grooming: Set up;Independent   Upper Body Bathing: Set up;Independent   Lower Body Bathing: Set up;Independent   Upper Body Dressing : Set up;Independent   Lower Body Dressing: Set up;Independent               Functional mobility during ADLs: Modified independent       Vision Patient Visual Report: No change from baseline       Perception     Praxis      Pertinent Vitals/Pain Pain Assessment Pain Assessment: 0-10 Pain Score: 2  Pain Location: RLE Pain Descriptors / Indicators: Operative site guarding Pain Intervention(s): Limited activity within patient's tolerance     Hand Dominance     Extremity/Trunk Assessment  Upper Extremity Assessment Upper Extremity Assessment: Overall WFL for tasks assessed           Communication Communication Communication: No difficulties   Cognition Arousal/Alertness: Awake/alert Behavior During Therapy: WFL for tasks assessed/performed Overall Cognitive Status: Within Functional Limits for tasks assessed                                       General Comments       Exercises     Shoulder Instructions      Home Living Family/patient expects to be discharged to:: Private residence Living Arrangements: Spouse/significant other Available Help at Discharge: Family;Available 24  hours/day Type of Home: House Home Access: Stairs to enter [employer] of Steps: 4 Entrance Stairs-Rails: Can reach both Home Layout: One level     Bathroom Shower/Tub: Walk-in shower;Door         Home Equipment: Agricultural consultant (2 wheels);BSC/3in1;Cane - single point;Shower seat - built in          Prior Functioning/Environment Prior Level of Function : Independent/Modified Independent             Mobility Comments: indep and active. retired Marketing executive and air guy ADLs Comments: Independent with ADLs, and IADL tasks.        OT Problem List: Decreased range of motion      OT Treatment/Interventions:      OT Goals(Current goals can be found in the care plan section) Acute Rehab OT Goals Patient Stated Goal: To return home OT Goal Formulation: With patient Time For Goal Achievement: 08/31/21 Potential to Achieve Goals: Good  OT Frequency:      Co-evaluation              AM-PAC OT "6 Clicks" Daily Activity     Outcome Measure Help from another person eating meals?: None Help from another person taking care of personal grooming?: None Help from another person toileting, which includes using toliet, bedpan, or urinal?: None Help from another person bathing (including washing, rinsing, drying)?: A Little Help from another person to put on and taking off regular upper body clothing?: None Help from another person to put on and taking off regular lower body clothing?: None 6 Click Score: 23   End of Session Equipment Utilized During Treatment: Gait belt  Activity Tolerance: Patient tolerated treatment well Patient left: in chair;with chair alarm set;with family/visitor present  OT Visit Diagnosis: Muscle weakness (generalized) (M62.81)                Time: 3545-6256 OT Time Calculation (min): 15 min Charges:  OT General Charges $OT Visit: 1 Visit OT Evaluation $OT Eval Low Complexity: 1 Low  Olegario Messier, MS, OTR/L   Olegario Messier 08/31/2021, 10:11 AM

## 2021-08-31 NOTE — Plan of Care (Signed)
  Problem: Education: Goal: Knowledge of General Education information will improve Description: Including pain rating scale, medication(s)/side effects and non-pharmacologic comfort measures Outcome: Progressing   Problem: Health Behavior/Discharge Planning: Goal: Ability to manage health-related needs will improve Outcome: Progressing   Problem: Clinical Measurements: Goal: Ability to maintain clinical measurements within normal limits will improve Outcome: Progressing   Problem: Clinical Measurements: Goal: Will remain free from infection Outcome: Progressing   Problem: Clinical Measurements: Goal: Diagnostic test results will improve Outcome: Progressing   Problem: Clinical Measurements: Goal: Respiratory complications will improve Outcome: Progressing   Problem: Clinical Measurements: Goal: Cardiovascular complication will be avoided Outcome: Progressing   Problem: Activity: Goal: Risk for activity intolerance will decrease Outcome: Progressing   Problem: Nutrition: Goal: Adequate nutrition will be maintained Outcome: Progressing   Problem: Coping: Goal: Level of anxiety will decrease Outcome: Progressing   Problem: Elimination: Goal: Will not experience complications related to bowel motility Outcome: Progressing   Problem: Elimination: Goal: Will not experience complications related to urinary retention Outcome: Progressing   Problem: Skin Integrity: Goal: Risk for impaired skin integrity will decrease Outcome: Progressing

## 2021-08-31 NOTE — Progress Notes (Signed)
   Subjective: 1 Day Post-Op Procedure(s) (LRB): COMPUTER ASSISTED TOTAL KNEE ARTHROPLASTY, RNFA (Right) Patient reports pain as mild.   Patient is well, and has had no acute complaints or problems Denies any CP, SOB, ABD pain. We will continue therapy today.  Plan is to go Home after hospital stay.  Objective: Vital signs in last 24 hours: Temp:  [97.6 F (36.4 C)-98 F (36.7 C)] 97.7 F (36.5 C) (05/20 0747) Pulse Rate:  [63-98] 63 (05/20 0747) Resp:  [15-27] 18 (05/20 0747) BP: (120-140)/(77-107) 120/80 (05/20 0747) SpO2:  [93 %-98 %] 98 % (05/20 0747)  Intake/Output from previous day: 05/19 0701 - 05/20 0700 In: 2165.9 [P.O.:225; I.V.:1430.9; IV Piggyback:510] Out: 1050 [Urine:550; Drains:240; Blood:50] Intake/Output this shift: No intake/output data recorded.  No results for input(s): HGB in the last 72 hours. No results for input(s): WBC, RBC, HCT, PLT in the last 72 hours. No results for input(s): NA, K, CL, CO2, BUN, CREATININE, GLUCOSE, CALCIUM in the last 72 hours. No results for input(s): LABPT, INR in the last 72 hours.  EXAM General - Patient is Alert, Appropriate, and Oriented Extremity - Neurovascular intact Sensation intact distally Intact pulses distally Dorsiflexion/Plantar flexion intact No cellulitis present Compartment soft Dressing - dressing C/D/I and no drainage, hemovac removed Motor Function - intact, moving foot and toes well on exam.   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   Weight as of this encounter: 120.6 kg. Advance diet Up with therapy Pain well controlled VSS Good progress with PT CM to assist with discharge to home with HHPT today  DVT Prophylaxis - Lovenox, TEDs, SCDs Weight-Bearing as tolerated to right leg   T. , PA-C [facility] 08/31/2021, 10:14 AM

## 2021-08-31 NOTE — Progress Notes (Signed)
Physical Therapy Treatment Patient Details Name: Mitchell Case MRN: [id number] DOB: Jul 01, 1955 Today's Date: 08/31/2021   History of Present Illness Pt is a 66 y.o. male who was admitted for a R TKR. History includes: HTN, HLD, DM and recent L TKR.    PT Comments    The pt presents this session in great spirits. He is motivated to mobilize with PT so he can progress to d/c home. The pt is able to ambulate community distances and navigate 4 steps with no significant balance deficits present. The pt is presenting with a gait speed of 1.2 ft/s and demonstrated taking some steps without the use of an AD. The pt is lacking 3 degrees to neutral knee extension at this time and was prescribed exercises in order to help facilitate full knee extension. The pt continues to be appropriate for d/c home with family care and HHPT.    Recommendations for follow up therapy are one component of a multi-disciplinary discharge planning process, led by the attending physician.  Recommendations may be updated based on patient status, additional functional criteria and insurance authorization.  Follow Up Recommendations  Home health PT     Assistance Recommended at Discharge Set up Supervision/Assistance  Patient can return home with the following Help with stairs or ramp for entrance   Equipment Recommendations  None recommended by PT    Recommendations for Other Services       Precautions / Restrictions Precautions Precautions: Fall;Knee Precaution Booklet Issued: No Restrictions Weight Bearing Restrictions: No RLE Weight Bearing: Weight bearing as tolerated     Mobility  Bed Mobility               General bed mobility comments: Pt. up in chair upon arrival    Transfers Overall transfer level: Modified independent Equipment used: Rolling walker (2 wheels) Transfers: Sit to/from Stand Sit to Stand: Modified independent (Device/Increase time)           General transfer comment: Able  to stand from bedside chair.    Ambulation/Gait Ambulation/Gait assistance: Modified independent (Device/Increase time) Gait Distance (Feet): 250 Feet Assistive device: Rolling walker (2 wheels) Gait Pattern/deviations: Step-through pattern Gait velocity: 1.2 ft/sec Gait velocity interpretation: <1.31 ft/sec, indicative of household ambulator       Stairs Stairs: Yes Stairs assistance: Min guard Stair Management: One rail Left, Forwards, Step to pattern Number of Stairs: 4     Wheelchair Mobility    Modified Rankin (Stroke Patients Only)       Balance   Sitting-balance support: Feet supported, Bilateral upper extremity supported Sitting balance-Leahy Scale: Good       Standing balance-Leahy Scale: Good                              Cognition Arousal/Alertness: Awake/alert Behavior During Therapy: WFL for tasks assessed/performed Overall Cognitive Status: Within Functional Limits for tasks assessed                                          Exercises Total Joint Exercises Ankle Circles/Pumps: AROM, Both (As many as possible x1 min) Quad Sets: AROM, Right, 10 reps Heel Slides: AROM, Right, 10 reps Straight Leg Raises: AROM, Right, 10 reps Knee Flexion: AROM, 10 reps, Right Other Exercises Other Exercises: R knee ROM: 3-95 degrees    General Comments  Pertinent Vitals/Pain Pain Assessment Pain Score: 2  Pain Location: R knee Pain Descriptors / Indicators: Sore Pain Intervention(s): Monitored during session    Home Living Family/patient expects to be discharged to:: Private residence Living Arrangements: Spouse/significant other Available Help at Discharge: Family;Available 24 hours/day Type of Home: House Home Access: Stairs to enter Entrance Stairs-Rails: Can reach both Entrance Stairs-Number of Steps: 4   Home Layout: One level Home Equipment: Agricultural consultant (2 wheels);BSC/3in1;Cane - single point;Shower seat  - built in      Prior Function            PT Goals (current goals can now be found in the care plan section) Acute Rehab PT Goals Patient Stated Goal: to go home PT Goal Formulation: With patient Time For Goal Achievement: 09/13/21 Potential to Achieve Goals: Good Progress towards PT goals: Progressing toward goals    Frequency    BID      PT Plan Current plan remains appropriate    Co-evaluation              AM-PAC PT "6 Clicks" Mobility   Outcome Measure  Help needed turning from your back to your side while in a flat bed without using bedrails?: None Help needed moving from lying on your back to sitting on the side of a flat bed without using bedrails?: None Help needed moving to and from a bed to a chair (including a wheelchair)?: A Little Help needed standing up from a chair using your arms (e.g., wheelchair or bedside chair)?: A Little Help needed to walk in hospital room?: A Little Help needed climbing 3-5 steps with a railing? : A Little 6 Click Score: 20    End of Session Equipment Utilized During Treatment: Gait belt Activity Tolerance: Patient tolerated treatment well Patient left: in chair;with chair alarm set Nurse Communication: Mobility status PT Visit Diagnosis: Muscle weakness (generalized) (M62.81);Difficulty in walking, not elsewhere classified (R26.2);Pain Pain - Right/Left: Right Pain - part of body: Knee     Time: 1000-1046 PT Time Calculation (min) (ACUTE ONLY): 46 min  Charges:  $Gait Training: 8-22 mins $Therapeutic Exercise: 23-37 mins                     12:11 PM, 08/31/21 Bernhardt Riemenschneider A. Mordecai Maes PT, DPT Physical Therapist - [facility]    Jahlani Lorentz A Zaki Gertsch 08/31/2021, 12:08 PM

## 2021-08-31 NOTE — Progress Notes (Signed)
Discharge note:   Pt given discharge instructions and verbalized understanding. TED hose on both legs, extra honeycomb dressings given.  Pt wheeled to car by staff.  Wife providing transportation.  CPAP, polar care, and all belongings with pt.

## 2021-09-10 ENCOUNTER — Encounter: Payer: Self-pay | Admitting: Orthopedic Surgery

## 2022-06-02 ENCOUNTER — Encounter: Payer: Self-pay | Admitting: *Deleted

## 2022-06-02 ENCOUNTER — Encounter: Payer: Medicare HMO | Attending: Family Medicine | Admitting: *Deleted

## 2022-06-02 VITALS — BP 138/84 | Ht 76.0 in | Wt 275.0 lb

## 2022-06-02 DIAGNOSIS — E1165 Type 2 diabetes mellitus with hyperglycemia: Secondary | ICD-10-CM

## 2022-06-02 DIAGNOSIS — H3530 Unspecified macular degeneration: Secondary | ICD-10-CM | POA: Diagnosis not present

## 2022-06-02 DIAGNOSIS — E6609 Other obesity due to excess calories: Secondary | ICD-10-CM | POA: Diagnosis not present

## 2022-06-02 DIAGNOSIS — E1159 Type 2 diabetes mellitus with other circulatory complications: Secondary | ICD-10-CM | POA: Insufficient documentation

## 2022-06-02 DIAGNOSIS — Z6833 Body mass index (BMI) 33.0-33.9, adult: Secondary | ICD-10-CM | POA: Insufficient documentation

## 2022-06-02 NOTE — Patient Instructions (Addendum)
Check blood sugars before breakfast or 2 hrs after one meal every day Bring blood sugar records to the next appointment  Exercise:  Continue program for    60  minutes   4  days a week  Eat 3 meals day,   1-2  snacks a day Space meals 4-6 hours apart Include 1 serving of protein with each meal Avoid sugar sweetened drinks (juices)  Complete 3 Day Food Record and bring to next appt  Return for appointment on:  Friday July 11, 2022 at 8:15 am with [facility] (dietitian)

## 2022-06-02 NOTE — Progress Notes (Signed)
Diabetes Self-Management Education  Visit Type: First/Initial  Appt. Start Time: 0840 Appt. End Time: 1005  06/02/2022  Mr. Mitchell Case, identified by name and date of birth, is a 67 y.o. male with a diagnosis of Diabetes: Type 2.   ASSESSMENT  Blood pressure 138/84, height 6' 4"$  (1.93 m), weight 275 lb (124.7 kg). Body mass index is 33.47 kg/m.   Diabetes Self-Management Education - 06/02/22 1514       Visit Information   Visit Type First/Initial      Initial Visit   Diabetes Type Type 2    Date Diagnosed 10 yrs ago    Are you currently following a meal plan? No    Are you taking your medications as prescribed? Yes      Health Coping   How would you rate your overall health? Good      Psychosocial Assessment   Patient Belief/Attitude about Diabetes Other (comment)   "It sucks"   What is the hardest part about your diabetes right now, causing you the most concern, or is the most worrisome to you about your diabetes?   Making healty food and beverage choices    Self-care barriers Impaired vision    Self-management support Doctor's office;Family    Other persons present Spouse/SO    Patient Concerns Nutrition/Meal planning;Glycemic Control;Medication;Monitoring;Healthy Lifestyle    Special Needs Large print    Preferred Learning Style Other (comment)   talking/discussion   Learning Readiness Ready    How often do you need to have someone help you when you read instructions, pamphlets, or other written materials from your doctor or pharmacy? 4 - Often   Macular degeneration. He has magnifier on his phone.     Pre-Education Assessment   Patient understands the diabetes disease and treatment process. Needs Review    Patient understands incorporating nutritional management into lifestyle. Needs Instruction    Patient undertands incorporating physical activity into lifestyle. Demonstrates understanding / competency    Patient understands using medications safely. Needs  Instruction    Patient understands monitoring blood glucose, interpreting and using results Comprehends key points    Patient understands prevention, detection, and treatment of acute complications. Needs Instruction    Patient understands prevention, detection, and treatment of chronic complications. Needs Review    Patient understands how to develop strategies to address psychosocial issues. Needs Instruction    Patient understands how to develop strategies to promote health/change behavior. Needs Instruction      Complications   Last HgB A1C per patient/outside source 7.8 %   04/21/2022   How often do you check your blood sugar? 1-2 times/day    Fasting Blood glucose range (mg/dL) 130-179 He reports fasting blood sugars usually 160-170's but these have been 200-500 mg/dL since starting steroids.    Have you had a dilated eye exam in the past 12 months? Yes    Have you had a dental exam in the past 12 months? Yes    Are you checking your feet? Yes    How many days per week are you checking your feet? 7      Dietary Intake   Breakfast shredded wheat, unsweetened almond milk, banana; eggs, toast, bacon, grits; oatmeal, sausage    Lunch sandwich - cheese, [place], ham, bologna, peanut butter and banana; [place] yogurt; apple, orange    Dinner [place], chicken, beef, pork, fish; potatoes, peas, beans, corn, rice, pasta, broccoli, cauliflower, brussel sprouts, squash, zucchini, tomatoes, cuccumbers, carrots    Snack (evening) nuts  Beverage(s) water, coffee, juice      Activity / Exercise   Activity / Exercise Type Moderate (swimming / aerobic walking)   [employer] - cardio, strength training   How many days per week do you exercise? 4    How many minutes per day do you exercise? 60    Total minutes per week of exercise 240      Patient Education   Previous Diabetes Education No    Disease Pathophysiology Definition of diabetes, type 1 and 2, and the diagnosis of diabetes;Factors that  contribute to the development of diabetes;Explored patient's options for treatment of their diabetes    Healthy Eating Role of diet in the treatment of diabetes and the relationship between the three main macronutrients and blood glucose level;Food label reading, portion sizes and measuring food.;Reviewed blood glucose goals for pre and post meals and how to evaluate the patients' food intake on their blood glucose level.;Meal timing in regards to the patients' current diabetes medication.    Being Active Role of exercise on diabetes management, blood pressure control and cardiac health.    Medications Reviewed patients medication for diabetes, action, purpose, timing of dose and side effects.    Monitoring Purpose and frequency of SMBG.;Taught/discussed recording of test results and interpretation of SMBG.;Identified appropriate SMBG and/or A1C goals.    Chronic complications Relationship between chronic complications and blood glucose control    Diabetes Stress and Support Identified and addressed patients feelings and concerns about diabetes;Role of stress on diabetes      Individualized Goals (developed by patient)   Reducing Risk Other (comment)   improve blood sugars, decrease medications, prevent diabetes complications, lead a healthier lifestyle     Outcomes   Expected Outcomes Demonstrated interest in learning. Expect positive outcomes    Future DMSE 4-6 wks         Individualized Plan for Diabetes Self-Management Training:   Learning Objective:  Patient will have a greater understanding of diabetes self-management. Patient education plan is to attend individual and/or group sessions per assessed needs and concerns.   Plan:   Patient Instructions  Check blood sugars before breakfast or 2 hrs after one meal every day Bring blood sugar records to the next appointment  Exercise:  Continue program for    60  minutes   4  days a week  Eat 3 meals day,   1-2  snacks a day Space  meals 4-6 hours apart Include 1 serving of protein with each meal Avoid sugar sweetened drinks (juices)  Complete 3 Day Food Record and bring to next appt  Return for appointment on:  Friday July 11, 2022 at 8:15 am with [facility] (dietitian)  Expected Outcomes:  Demonstrated interest in learning. Expect positive outcomes  Education material provided:  General Meal Planning Guidelines Simple Meal Plan 3 Day Food Record  If problems or questions, patient to contact team via:   Johny Drilling, RN, [place] [phone]  Future DSME appointment: 4-6 wks July 11, 2022 with the dietitian

## 2022-06-03 IMAGING — DX DG KNEE 1-2V PORT*L*
2 series · 2 of 2 positions shown · non-contrast
Comparison: None.

CLINICAL DATA: Post left knee arthroplasty.

EXAM:
PORTABLE LEFT KNEE - 1-2 VIEW

[knee ap]
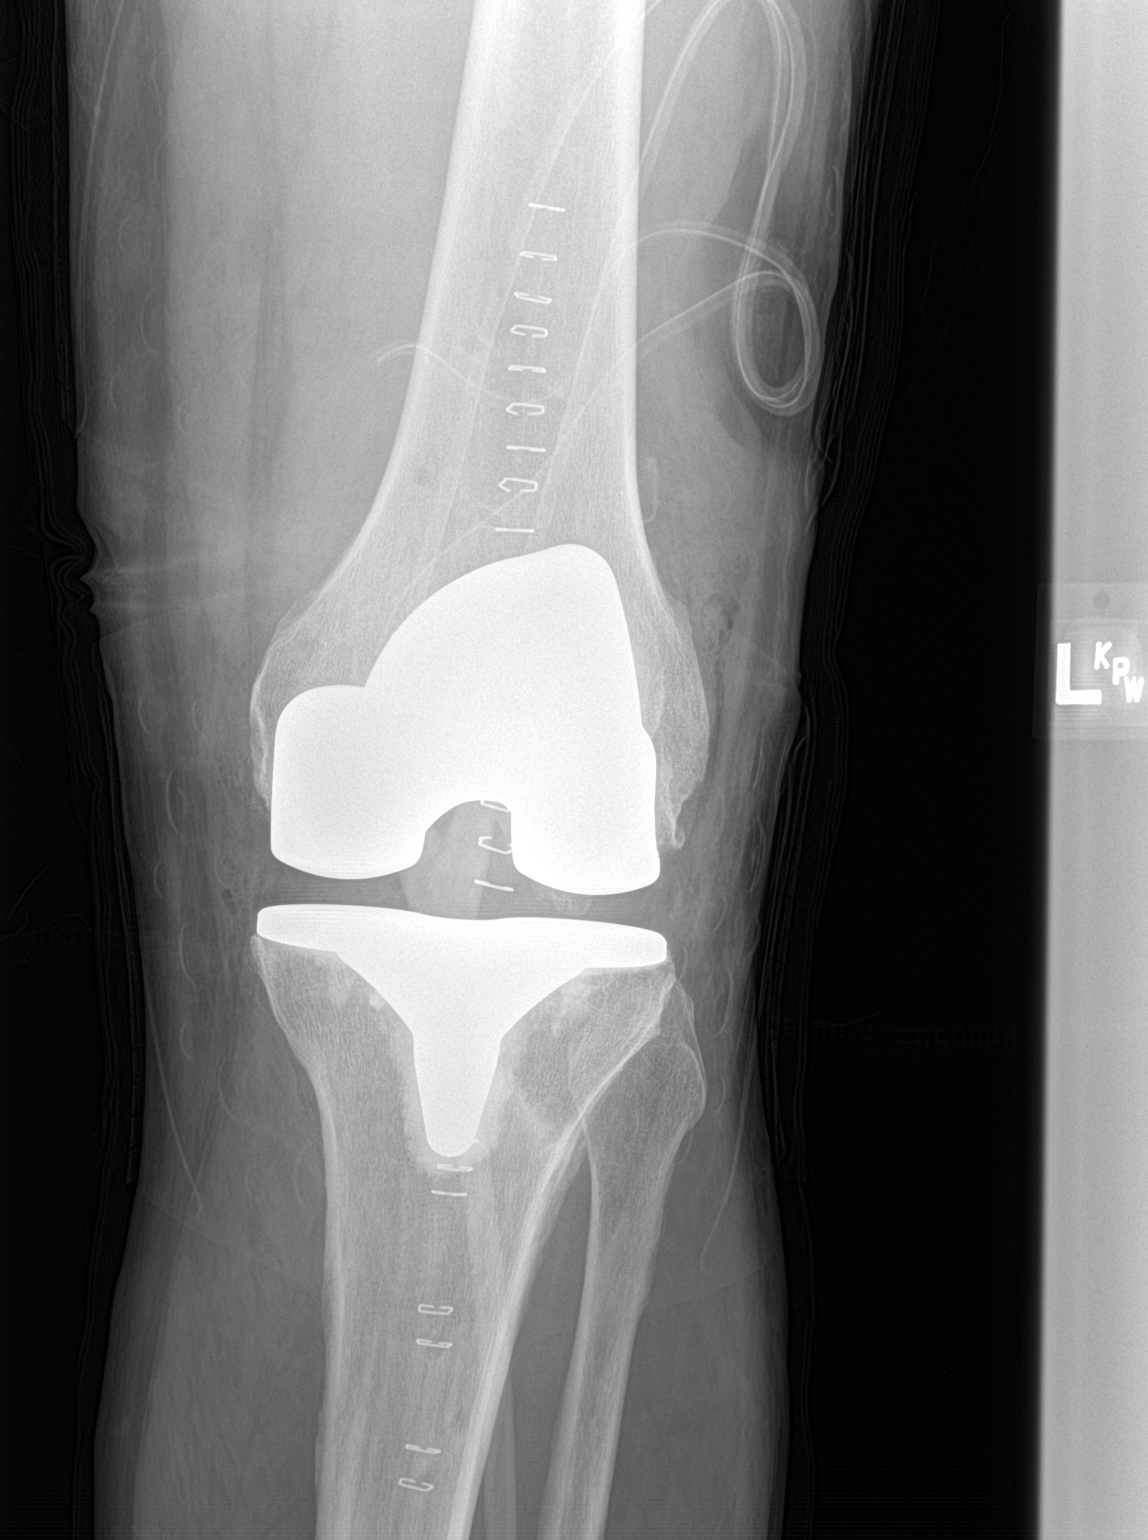

[knee lat]
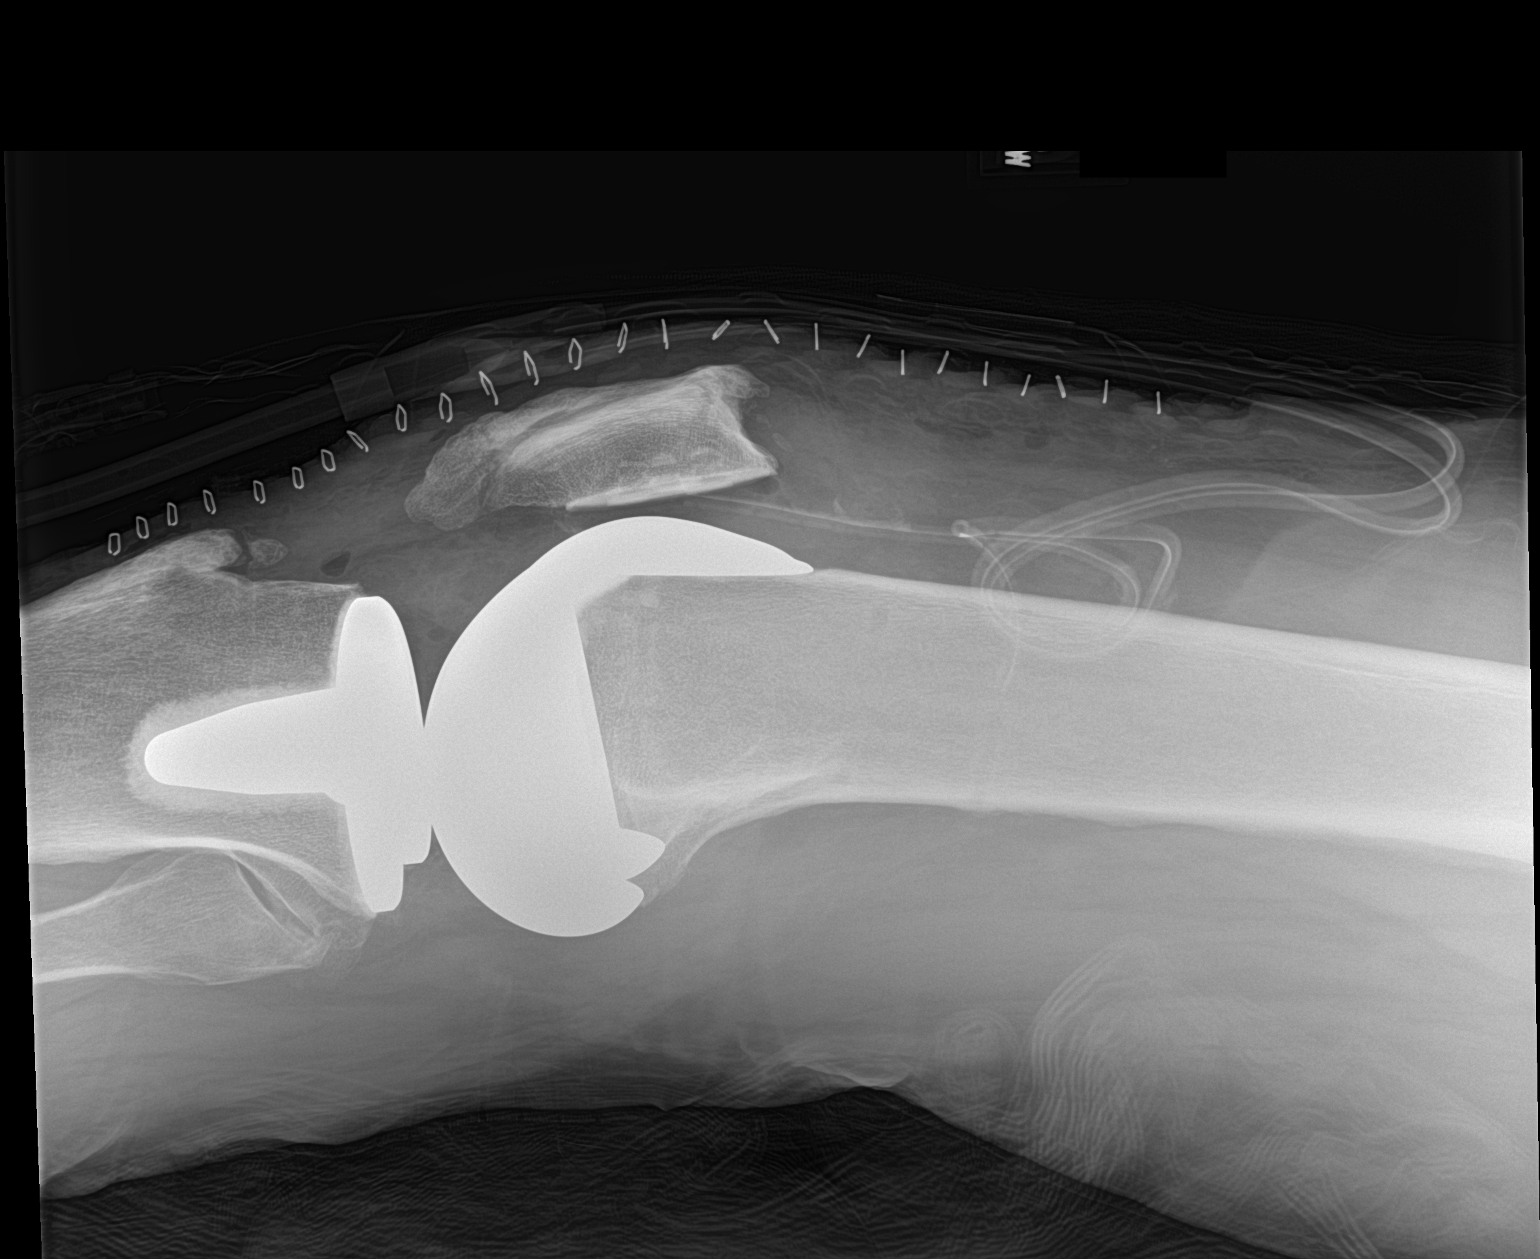

[2 of 2 positions shown; findings below may reference images not displayed]

FINDINGS: Examination demonstrates expected changes post left total knee
arthroplasty with prosthetic components intact and normally located.
Surgical drain is present.
IMPRESSION: Expected changes post left total knee arthroplasty.

## 2022-06-20 IMAGING — US US ABDOMEN LIMITED RUQ/ASCITES
1 series · 14 of 25 positions shown · non-contrast
Comparison: CT 08/20/2004.

CLINICAL DATA: Elevated liver enzymes.

EXAM:
ULTRASOUND ABDOMEN LIMITED RIGHT UPPER QUADRANT

[Series 1: us abdomen limited ruq (liver/gb) · 14 of 42 slices shown]
[im 1/42]
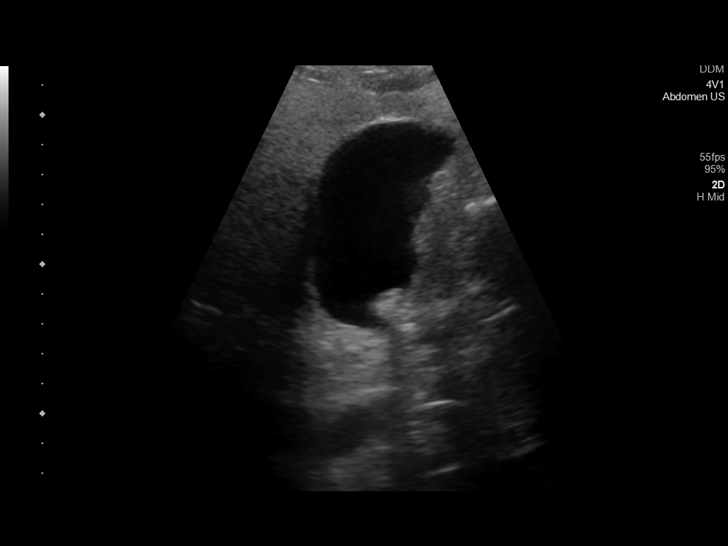
[im 4/42]
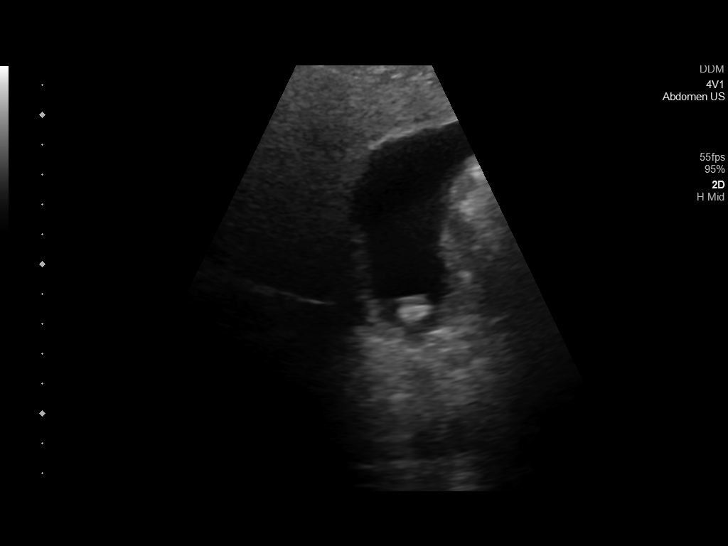
[im 7/42]
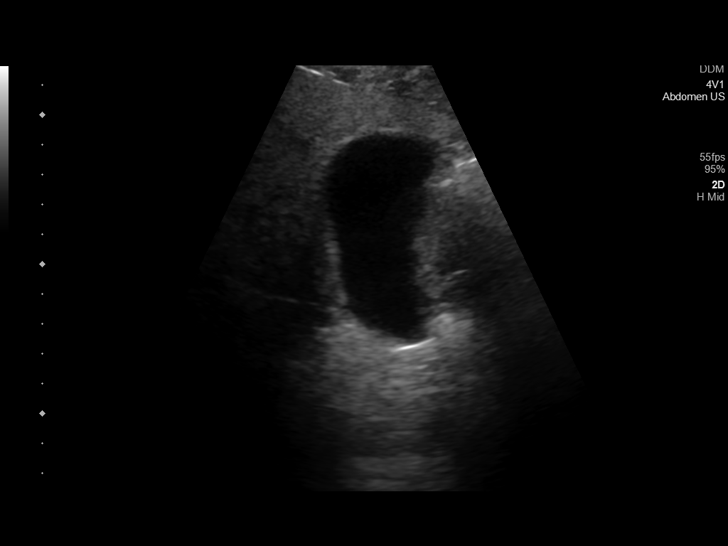
[im 11/42]
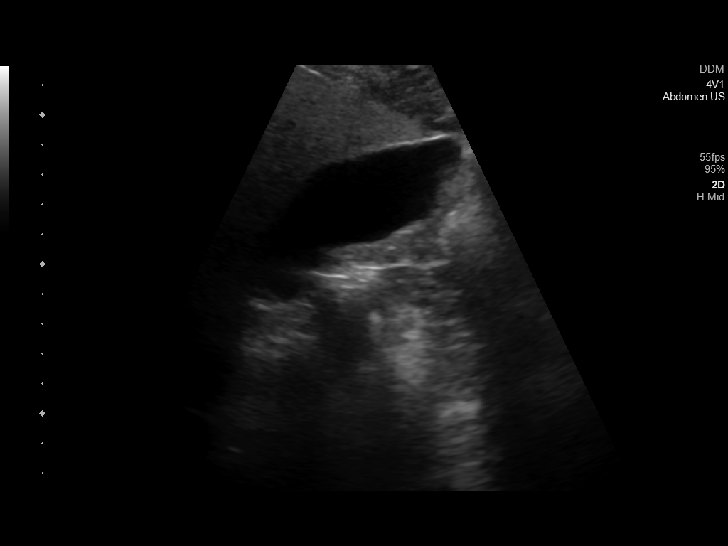
[im 14/42]
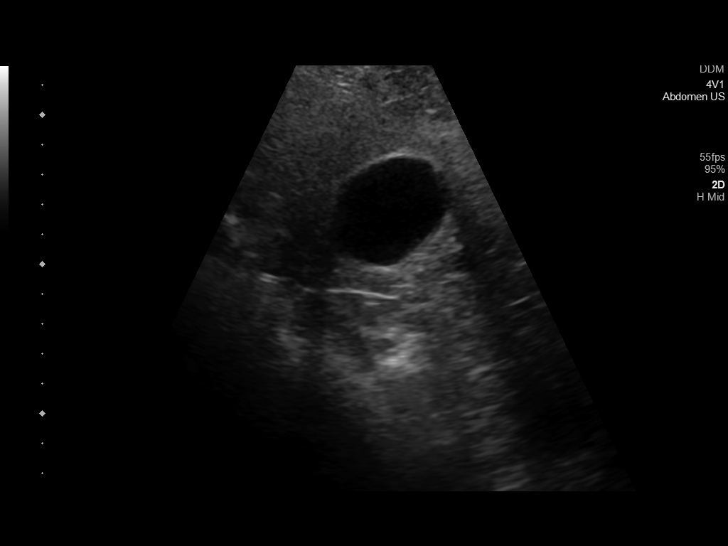
[im 16/42]
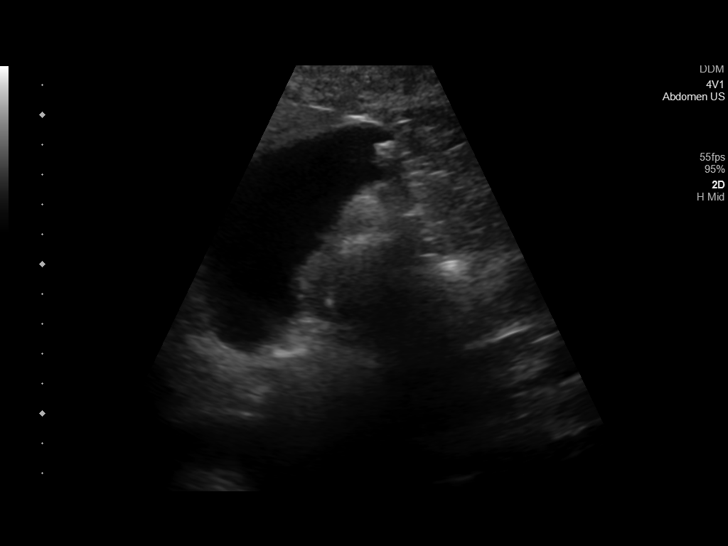
[im 19/42]
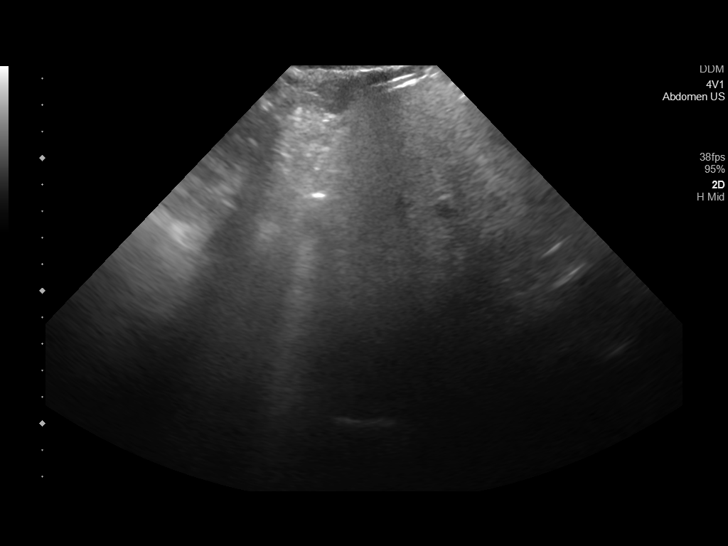
[im 23/42]
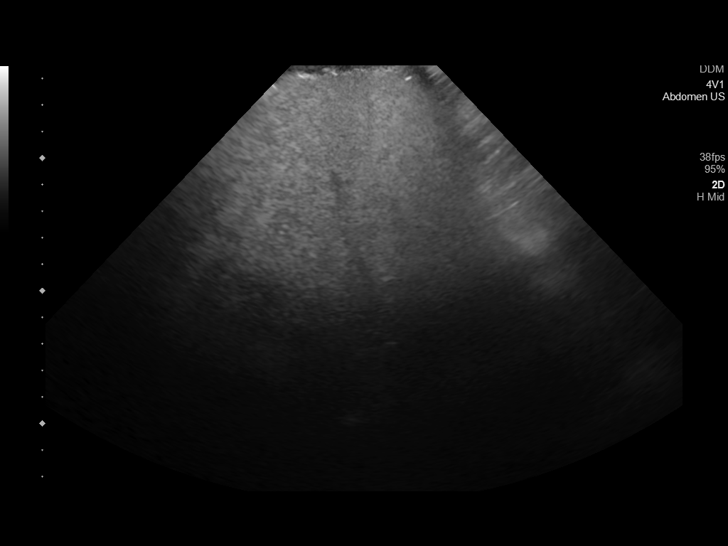
[im 26/42]
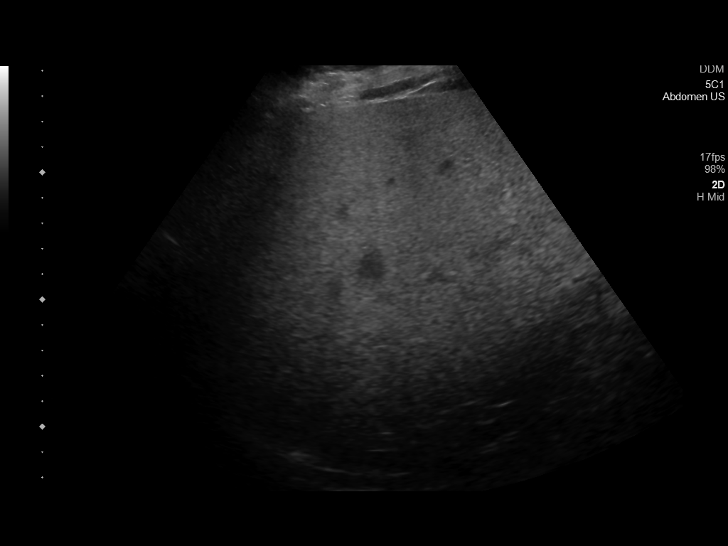
[im 28/42]
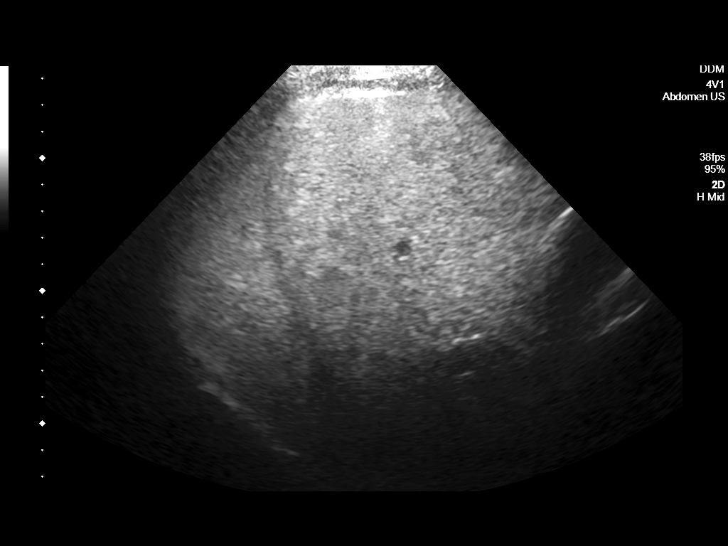
[im 31/42]
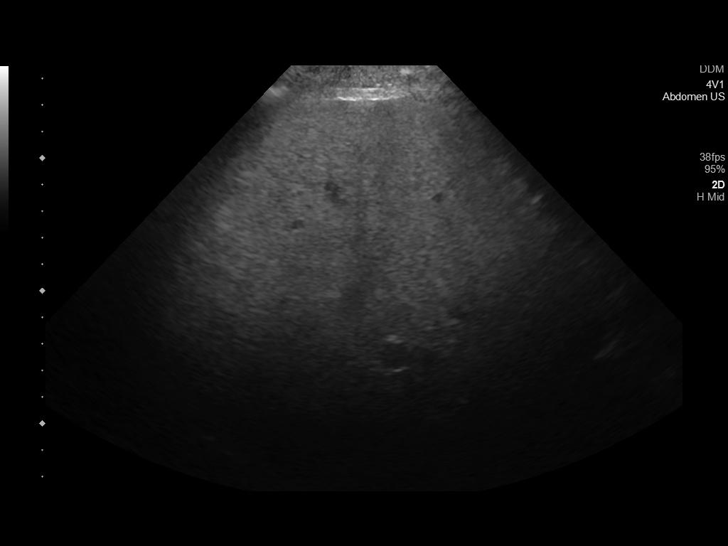
[im 35/42]
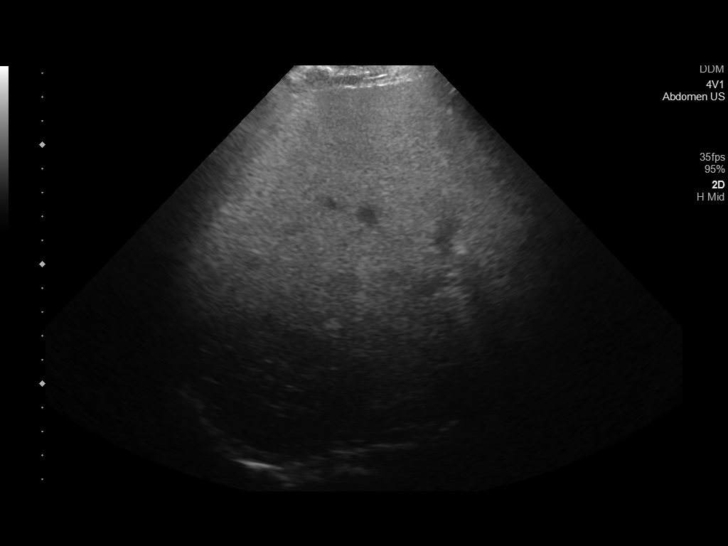
[im 38/42]
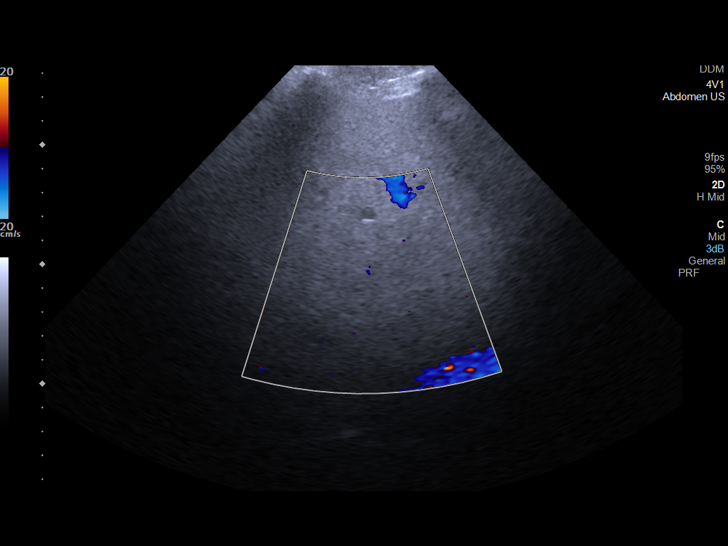
[im 42/42]
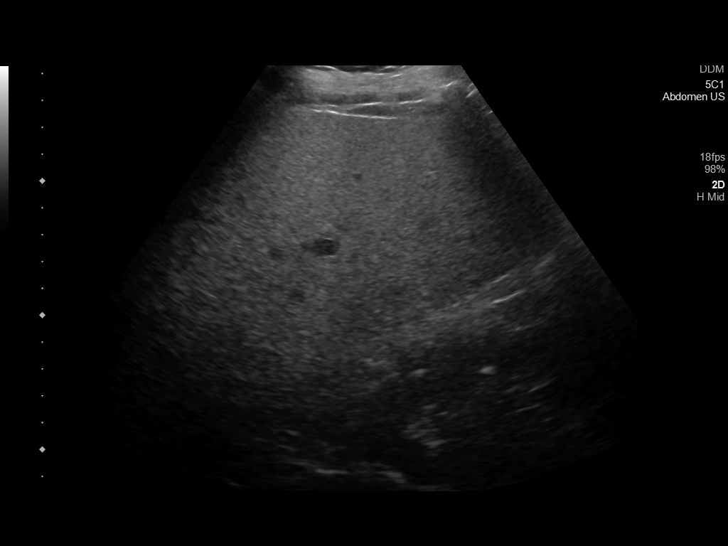

[14 of 25 positions shown; findings below may reference images not displayed]

FINDINGS: Gallbladder:

Tiny gallstones measuring up to 9 mm. Gallbladder wall thickness
normal. No sonographic Murphy sign noted by sonographer.

Common bile duct:

Diameter: 3.5 mm

Liver:

Increased hepatic echogenicity consistent fatty infiltration or
hepatocellular disease. No focal hepatic abnormality identified.
Portal vein is patent on color Doppler imaging with normal direction
of blood flow towards the liver.

Other: None.
IMPRESSION: 1. Tiny gallstones measuring up to 9 mm. Gallbladder wall thickness
normal. Negative Murphy sign. No biliary distention.

2. Increased hepatic echogenicity consistent fatty infiltration or
hepatocellular disease.

## 2022-07-11 ENCOUNTER — Encounter: Payer: Medicare HMO | Attending: Family Medicine | Admitting: Dietician

## 2022-07-11 ENCOUNTER — Encounter: Payer: Self-pay | Admitting: Dietician

## 2022-07-11 VITALS — Ht 76.0 in | Wt 252.2 lb

## 2022-07-11 DIAGNOSIS — E785 Hyperlipidemia, unspecified: Secondary | ICD-10-CM | POA: Insufficient documentation

## 2022-07-11 DIAGNOSIS — E6609 Other obesity due to excess calories: Secondary | ICD-10-CM | POA: Insufficient documentation

## 2022-07-11 DIAGNOSIS — E1165 Type 2 diabetes mellitus with hyperglycemia: Secondary | ICD-10-CM

## 2022-07-11 DIAGNOSIS — Z6830 Body mass index (BMI) 30.0-30.9, adult: Secondary | ICD-10-CM | POA: Insufficient documentation

## 2022-07-11 DIAGNOSIS — E1169 Type 2 diabetes mellitus with other specified complication: Secondary | ICD-10-CM | POA: Insufficient documentation

## 2022-07-11 NOTE — Progress Notes (Signed)
Diabetes Self-Management Education  Visit Type:  Follow-up  Appt. Start Time: 0815 Appt. End Time: [id number]  07/11/2022  Mr. Labon Font, identified by name and date of birth, is a 67 y.o. male with a diagnosis of Diabetes:  .   ASSESSMENT  Height 6\' 4"  (1.93 m), weight 252 lb 3.2 oz (114.4 kg). Body mass index is 30.7 kg/m.    Diabetes Self-Management Education - 0000000 [id number]       Complications   How often do you check your blood sugar? 3-4 times/day    Fasting Blood glucose range (mg/dL) 70-129    Postprandial Blood glucose range (mg/dL) <70;70-129    Number of hypoglycemic episodes per month 8    Can you tell when your blood sugar is low? Yes    What do you do if your blood sugar is low? drank orange juice, then graham cracker with peanut butter. Keeps glucose tablets, wife has glucose gel    Have you had a dilated eye exam in the past 12 months? Yes    Have you had a dental exam in the past 12 months? Yes    Are you checking your feet? Yes    How many days per week are you checking your feet? 7      Dietary Intake   Breakfast 3 meals and 1 snack daily      Activity / Exercise   Activity / Exercise Type Moderate (swimming / aerobic walking)    How many days per week do you exercise? 4    How many minutes per day do you exercise? 60    Total minutes per week of exercise 240      Patient Education   Healthy Eating Role of diet in the treatment of diabetes and the relationship between the three main macronutrients and blood glucose level;Food label reading, portion sizes and measuring food.;Reviewed blood glucose goals for pre and post meals and how to evaluate the patients' food intake on their blood glucose level.;Meal timing in regards to the patients' current diabetes medication.;Meal options for control of blood glucose level and chronic complications.    Being Active Role of exercise on diabetes management, blood pressure control and cardiac health.    Medications Reviewed  patients medication for diabetes, action, purpose, timing of dose and side effects.;Other (comment)   importance of consulting with medical provider regarding medication changes; avoiding cutting extended release medications   Monitoring Taught/discussed recording of test results and interpretation of SMBG.    Acute complications Taught prevention, symptoms, and  treatment of hypoglycemia - the 15 rule.      Post-Education Assessment   Patient understands the diabetes disease and treatment process. Demonstrates understanding / competency    Patient understands incorporating nutritional management into lifestyle. Demonstrates understanding / competency    Patient undertands incorporating physical activity into lifestyle. Demonstrates understanding / competency    Patient understands using medications safely. Comphrehends key points    Patient understands monitoring blood glucose, interpreting and using results Demonstrates understanding / competency    Patient understands prevention, detection, and treatment of acute complications. Demonstrates understanding / competency    Patient understands prevention, detection, and treatment of chronic complications. Comprehends key points    Patient understands how to develop strategies to address psychosocial issues. Comprehends key points    Patient understands how to develop strategies to promote health/change behavior. Demonstrates understanding / competency      Outcomes   Program Status Completed  Learning Objective:  Patient will have a greater understanding of diabetes self-management. Patient education plan is to attend individual and/or group sessions per assessed needs and concerns.  Additional Notes: Patient has lost about 33lbs in the past month due to dietary changes, resulting in improvement in BG control.   He has been treating low BG episodes appropriately; however, has made med adjustments on his own. He agrees to  consult with MD about this and will stop cutting his extended release Glipizide.  Discussed benefits of consistent carb intake and healthy carb choices; also discussed dietary adjustments if/ when patient nears weight loss goal. Offered additional help by phone/ email as needed.  Plan:   Patient Instructions  Continue with current eating pattern Aim for consistent carb intake with meals, close to 60g per meal to meet nutritional needs Avoid cutting Glipizide SR; discuss occurrence of low blood sugars with Dr Netty Starring and proper medication adjustment. Keep up regular exercise   Expected Outcomes:  Demonstrated interest in learning. Expect positive outcomes  Education material provided: Planning A Balanced Meal; sample menus, diabetes recipes  If problems or questions, patient to contact team via:  Phone and patient portal  Future DSME appointment: - PRN

## 2022-07-11 NOTE — Patient Instructions (Signed)
Continue with current eating pattern Aim for consistent carb intake with meals, close to 60g per meal to meet nutritional needs Avoid cutting Glipizide SR; discuss occurrence of low blood sugars with Dr Netty Starring and proper medication adjustment. Keep up regular exercise

## 2022-07-31 ENCOUNTER — Other Ambulatory Visit: Payer: Self-pay

## 2022-07-31 DIAGNOSIS — I10 Essential (primary) hypertension: Secondary | ICD-10-CM

## 2022-07-31 DIAGNOSIS — Z9189 Other specified personal risk factors, not elsewhere classified: Secondary | ICD-10-CM

## 2022-08-11 ENCOUNTER — Ambulatory Visit
Admission: RE | Admit: 2022-08-11 | Discharge: 2022-08-11 | Disposition: A | Discharge status: Home / Self Care | Payer: Medicare HMO | Source: Ambulatory Visit | Attending: Family Medicine | Admitting: Family Medicine

## 2022-08-11 DIAGNOSIS — Z9189 Other specified personal risk factors, not elsewhere classified: Secondary | ICD-10-CM

## 2022-08-11 DIAGNOSIS — I10 Essential (primary) hypertension: Secondary | ICD-10-CM

## 2024-04-19 ENCOUNTER — Encounter: Payer: Self-pay | Admitting: "Endocrinology

## 2024-04-19 ENCOUNTER — Ambulatory Visit: Admitting: "Endocrinology

## 2024-04-19 VITALS — BP 122/80 | HR 62 | Ht 76.0 in | Wt 268.0 lb

## 2024-04-19 DIAGNOSIS — E7800 Pure hypercholesterolemia, unspecified: Secondary | ICD-10-CM

## 2024-04-19 DIAGNOSIS — Z7984 Long term (current) use of oral hypoglycemic drugs: Secondary | ICD-10-CM | POA: Diagnosis not present

## 2024-04-19 DIAGNOSIS — E119 Type 2 diabetes mellitus without complications: Secondary | ICD-10-CM | POA: Diagnosis not present

## 2024-04-19 LAB — POCT GLYCOSYLATED HEMOGLOBIN (HGB A1C): Hemoglobin A1C: 7.6 % — AB (ref 4.0–5.6)

## 2024-04-19 MED ORDER — TIRZEPATIDE 2.5 MG/0.5ML ~~LOC~~ SOAJ: 2.5000 mg | SUBCUTANEOUS | 1 refills | Status: DC

## 2024-04-19 MED ORDER — FREESTYLE LIBRE 3 PLUS SENSOR MISC: 3 refills | Status: DC

## 2024-04-19 MED ORDER — METFORMIN HCL ER 500 MG PO TB24: 1000.0000 mg | ORAL_TABLET | Freq: Two times a day (BID) | ORAL | 4 refills | Status: AC

## 2024-04-19 NOTE — Patient Instructions (Signed)
 We will start you on an extended release version of metformin , which should help with the upset stomach. Start with one 500 mg capsule taken every evening with dinner. Stay on this for 3-5 days, then increase to 1 tablet with breakfast and one with dinner. If you do not have any upset stomach, gradually increase to the goal dose of 2 capsules with breakfast and 2 capsules with dinner. If you do have upset stomach, decrease it back to the previous dose that you tolerated.  Week 1: one tablet after breakfast and one after dinner  Week 2: one tablet after breakfast and two after dinner  Week 3 and onwards: two tablets after breakfast and two after dinner

## 2024-04-19 NOTE — Progress Notes (Signed)
 "   Outpatient Endocrinology Note Mitchell Birmingham, MD  04/19/2024   Mitchell Case October 16, 1955 [id number]  Referring Provider: Alla Amis, MD Primary Care Provider: Alla Amis, MD Reason for consultation: Subjective   Assessment & Plan  Diagnoses and all orders for this visit:  Type 2 diabetes mellitus without complication, without long-term current use of insulin  (HCC) -     POCT glycosylated hemoglobin (Hb A1C)  Long term (current) use of oral hypoglycemic drugs  Pure hypercholesterolemia  Other orders -     metFORMIN  (GLUCOPHAGE -XR) 500 MG 24 hr tablet; Take 2 tablets (1,000 mg total) by mouth 2 (two) times daily with a meal. -     tirzepatide  (MOUNJARO ) 2.5 MG/0.5ML Pen; Inject 2.5 mg into the skin once a week. -     Continuous Glucose Sensor (FREESTYLE LIBRE 3 PLUS SENSOR) MISC; Change sensor every 15 days.   Diabetes Type II complicated by hyperglycemia, No results found for: GFR Hba1c goal less than 7, current Hba1c is  Lab Results  Component Value Date   HGBA1C 7.6 (A) 04/19/2024   Will recommend the following: Metformin  extended release 500mg  every day  Week 1: one tablet after breakfast and one after dinner  Week 2: one tablet after breakfast and two after dinner  Week 3 and onwards: two tablets after breakfast and two after dinner  No known contraindications/side effects to any of above medications  -Last LD and Tg are as follows: 11/2023  LDL Calculated 0 - 130 mg/dL 876   No results found for: LDLCALC No results found for: TRIG -not on statin  -Follow low fat diet and exercise   -Blood pressure goal <140/90 - Microalbumin/creatinine goal is < 30 -Last MA/Cr is as follows: No results found for: MICROALBUR, MALB24HUR -on ACE/ARB lisinopril  10 mg every day  -diet changes including salt restriction -limit eating outside -counseled BP targets per standards of diabetes care -uncontrolled blood pressure can lead to retinopathy,  nephropathy and cardiovascular and atherosclerotic heart disease  Reviewed and counseled on: -A1C target -Blood sugar targets -Complications of uncontrolled diabetes  -Checking blood sugar before meals and bedtime and bring log next visit -All medications with mechanism of action and side effects -Hypoglycemia management: rule of 15's, Glucagon Emergency Kit and medical alert ID -low-carb low-fat plate-method diet -At least 20 minutes of physical activity per day -Annual dilated retinal eye exam and foot exam -compliance and follow up needs -follow up as scheduled or earlier if problem gets worse  Call if blood sugar is less than 70 or consistently above 250    Take a 15 gm snack of carbohydrate at bedtime before you go to sleep if your blood sugar is less than 100.    If you are going to fast after midnight for a test or procedure, ask your physician for instructions on how to reduce/decrease your insulin  dose.    Call if blood sugar is less than 70 or consistently above 250  -Treating a low sugar by rule of 15  (15 gms of sugar every 15 min until sugar is more than 70) If you feel your sugar is low, test your sugar to be sure If your sugar is low (less than 70), then take 15 grams of a fast acting Carbohydrate (3-4 glucose tablets or glucose gel or 4 ounces of juice or regular soda) Recheck your sugar 15 min after treating low to make sure it is more than 70 If sugar is still less than 70, treat again  with 15 grams of carbohydrate          Don't drive the hour of hypoglycemia  If unconscious/unable to eat or drink by mouth, use glucagon injection or nasal spray baqsimi and call 911. Can repeat again in 15 min if still unconscious.  Return in about 3 months (around 07/18/2024).   I have reviewed current medications, nurse's notes, allergies, vital signs, past medical and surgical history, family medical history, and social history for this encounter. Counseled patient on symptoms,  examination findings, lab findings, imaging results, treatment decisions and monitoring and prognosis. The patient understood the recommendations and agrees with the treatment plan. All questions regarding treatment plan were fully answered.  Mitchell Birmingham, MD  04/19/2024    History of Present Illness Mitchell Case is a 69 y.o. year old male who presents for evaluation of Type II diabetes mellitus.  Mitchell Case was first diagnosed around 2006.   Diabetes education -  Home diabetes regimen: Metformin  XR 500mg  qg   COMPLICATIONS -  MI/Stroke -  retinopathy -  neuropathy -  nephropathy  SYMPTOMS REVIEWED - Polyuria - Weight loss - Blurred vision  BLOOD SUGAR DATA CGM interpretation: At today's visit, we reviewed her CGM downloads. The full report is scanned in the media. Reviewing the CGM trends, BG are mostly well controlled except some highs in evenings and overnight.    Physical Exam  BP 122/80   Pulse 62   Ht 6' 4 (1.93 m)   Wt 268 lb (121.6 kg)   SpO2 97%   BMI 32.62 kg/m    Constitutional: well developed, well nourished Head: normocephalic, atraumatic Eyes: sclera anicteric, no redness Neck: supple Lungs: normal respiratory effort Neurology: alert and oriented Skin: dry, no appreciable rashes Musculoskeletal: no appreciable defects Psychiatric: normal mood and affect Diabetic Foot Exam - Simple   No data filed      Current Medications Patient's Medications  New Prescriptions   CONTINUOUS GLUCOSE SENSOR (FREESTYLE LIBRE 3 PLUS SENSOR) MISC    Change sensor every 15 days.   TIRZEPATIDE  (MOUNJARO ) 2.5 MG/0.5ML PEN    Inject 2.5 mg into the skin once a week.  Previous Medications   CEPHALEXIN (KEFLEX) 500 MG CAPSULE    SMARTSIG:4 Tablet(s) By Mouth   CINNAMON PO    Take 1 capsule by mouth daily.   CONTINUOUS GLUCOSE SENSOR (FREESTYLE LIBRE 3 PLUS SENSOR) MISC    USE 1 EACH EVERY 14 (FOURTEEN) DAYS USE SENSOR FOR BLOOD GLUCOSE MONITORING   DIAZEPAM  (VALIUM )  5 MG TABLET    Take 5 mg by mouth every 6 (six) hours as needed for anxiety. For Vertigo   FLUTICASONE  (FLONASE ) 50 MCG/ACT NASAL SPRAY    Place 1 spray into both nostrils daily as needed for allergies or rhinitis.   LISINOPRIL  (PRINIVIL ,ZESTRIL ) 10 MG TABLET    Take 10 mg by mouth at bedtime.   MECLIZINE  (ANTIVERT ) 12.5 MG TABLET    Take 12.5 mg by mouth 3 (three) times daily as needed for dizziness.   MULTIPLE VITAMINS-MINERALS (OCUVITE PO)    Take 1 tablet by mouth daily.   PREDNISONE (DELTASONE) 10 MG TABLET    4 tabs po daily x 3 days, 3 tabs po daily x 3 days, 2 tabs po daily x 3 days, 1 tab po daily x 3 days   TAMSULOSIN  (FLOMAX ) 0.4 MG CAPS CAPSULE    Take 0.4 mg by mouth at bedtime.  Modified Medications   Modified Medication Previous Medication  METFORMIN  (GLUCOPHAGE -XR) 500 MG 24 HR TABLET metFORMIN  (GLUCOPHAGE -XR) 500 MG 24 hr tablet      Take 2 tablets (1,000 mg total) by mouth 2 (two) times daily with a meal.    SMARTSIG:1 Tablet(s) By Mouth Every Evening  Discontinued Medications   EZETIMIBE (ZETIA) 10 MG TABLET    Take 1 tablet by mouth daily.   GLIPIZIDE (GLUCOTROL XL) 10 MG 24 HR TABLET    Take 1 tablet by mouth daily. 3/29 began taking 1/2 tab about 06/13/22 due to frequent hypoglycemia. Advised pt to avoid with SR/ XL meds. Advised him to consult with prescribing provider.   JANUVIA 25 MG TABLET    Take 1 tablet by mouth daily.    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    History of kidney stones    HLD (hyperlipidemia)    Hypertension    Macular degeneration    Sleep apnea     Past Surgical History Past Surgical History:  Procedure Laterality Date   ACHILLES TENDON REPAIR     COLONOSCOPY     KIDNEY STONE SURGERY Bilateral     KNEE ARTHROPLASTY Left 07/27/2020   Procedure: COMPUTER ASSISTED TOTAL KNEE ARTHROPLASTY;  Surgeon: Mardee Lynwood SQUIBB, MD;  Location: [facility] ORS;  Service: Orthopedics;  Laterality: Left;   KNEE ARTHROPLASTY Right 08/30/2021   Procedure: COMPUTER ASSISTED TOTAL KNEE ARTHROPLASTY, RNFA;  Surgeon: Mardee Lynwood SQUIBB, MD;  Location: [facility] ORS;  Service: Orthopedics;  Laterality: Right;  RNFA TO ASSIST    Family History family history includes Diabetes in his brother, father, and mother.  Social History Social History   Socioeconomic History   Marital status: Married    Spouse name: Not on file   Number of children: Not on file   Years of education: Not on file   Highest education level: Not on file  Occupational History   Not on file  Tobacco Use   Smoking status: Former    Types: Cigars   Smokeless tobacco: Never  Vaping Use   Vaping status: Never Used  Substance and Sexual Activity   Alcohol use: No   Drug use: No   Sexual activity: Not on file  Other Topics Concern   Not on file  Social History Narrative   Lives at home with wife Suzen.   Social Drivers of Health   Tobacco Use: Medium Risk (04/19/2024)   Patient History    Smoking Tobacco Use: Former    Smokeless Tobacco Use: Never    Passive Exposure: Not on file  Financial Resource Strain: Low Risk  (02/16/2024)   Received from [facility]   Overall Financial Resource Strain (CARDIA)    Difficulty of Paying Living Expenses: Not hard at all  Food Insecurity: No Food Insecurity (02/16/2024)   Received from [facility]   Epic    Within the past 12 months, you worried that your food would run out before you got the money to buy more.: Never true    Within the past 12 months, the food you bought just didn't last and you didn't have money to get more.: Never true  Transportation Needs: No Transportation Needs (02/16/2024)   Received from [facility]   PRAPARE -  Transportation    In the past 12 months, has lack of transportation kept you from medical appointments or from getting medications?: No    Lack of Transportation (Non-Medical): No  Physical Activity: Not on file  Stress: Not on file  Social Connections: Not on file  Intimate Partner Violence: Not on file  Depression (PHQ2-9): Low Risk (06/02/2022)   Depression (PHQ2-9)    PHQ-2 Score: 0  Alcohol Screen: Not on file  Housing: Low Risk  (02/16/2024)   Received from [facility]   Epic    In the last 12 months, was there a time when you were not able to pay the mortgage or rent on time?: No    In the past 12 months, how many times have you moved where you were living?: 0    At any time in the past 12 months, were you homeless or living in a shelter (including now)?: No  Utilities: Not At Risk (02/16/2024)   Received from [facility]   Epic    In the past 12 months has the electric, gas, oil, or water company threatened to shut off services in your home?: No  Health Literacy: Not on file    Lab Results  Component Value Date   HGBA1C 7.6 (A) 04/19/2024   HGBA1C 6.7 (H) 08/21/2021   HGBA1C 6.9 (H) 07/13/2020   No results found for: CHOL No results found for: HDL No results found for: LDLCALC No results found for: TRIG No results found for: [facility] Lab Results  Component Value Date   CREATININE 1.08 08/21/2021   No results found for: GFR No results found for: MACKEY CURRENT    Component Value Date/Time   NA 137 08/21/2021 1045   K 4.4 08/21/2021 1045   CL 108 08/21/2021 1045   CO2 19 (L) 08/21/2021 1045   GLUCOSE 113 (H) 08/21/2021 1045   BUN 16 08/21/2021 1045   CREATININE 1.08 08/21/2021 1045   CALCIUM  9.3 08/21/2021 1045   PROT 7.7 08/21/2021 1045   ALBUMIN 4.2 08/21/2021 1045   AST 45 (H) 08/21/2021 1045   ALT 53 (H) 08/21/2021 1045   ALKPHOS 35 (L) 08/21/2021 1045   BILITOT 0.8 08/21/2021 1045   GFRNONAA >60  08/21/2021 1045   GFRAA >60 08/28/2016 0954      Latest Ref Rng & Units 08/21/2021   10:45 AM 07/13/2020    1:26 PM 08/28/2016    9:54 AM  BMP  Glucose 70 - 99 mg/dL 886  852  796   BUN 8 - 23 mg/dL 16  16  18    Creatinine 0.61 - 1.24 mg/dL 8.91  8.87  9.01   Sodium 135 - 145 mmol/L 137  139  137   Potassium 3.5 - 5.1 mmol/L 4.4  4.0  4.8   Chloride 98 - 111 mmol/L 108  107  108   CO2 22 - 32 mmol/L 19  24  19    Calcium  8.9 - 10.3 mg/dL 9.3  9.6  9.3        Component Value Date/Time   WBC 6.8 08/21/2021 1045   RBC 4.40 08/21/2021 1045   HGB 13.8 08/21/2021 1045   HCT 40.7 08/21/2021 1045   PLT 274 08/21/2021 1045   MCV 92.5 08/21/2021 1045   MCH 31.4 08/21/2021 1045   MCHC 33.9 08/21/2021 1045   RDW 12.4 08/21/2021 1045     Parts of this note may have been  dictated using voice recognition software. There may be variances in spelling and vocabulary which are unintentional. Not all errors are proofread. Please notify the dino if any discrepancies are noted or if the meaning of any statement is not clear.      [1]  Allergies Allergen Reactions   Penicillins Swelling    IgE = 29 (WNL) on 07/13/2020  Has patient had a PCN reaction causing immediate rash, facial/tongue/throat swelling, SOB or lightheadedness with hypotension: No Has patient had a PCN reaction causing severe rash involving mucus membranes or skin necrosis: No Has patient had a PCN reaction that required hospitalization No Has patient had a PCN reaction occurring within the last 10 years: No If all of the above answers are NO, then may proceed with Cephalosporin use.    Sulfa Antibiotics Itching   Metformin  Other (See Comments)    Upset stomach   Atorvastatin     Muscle Pain   Hydrochlorothiazide Itching   "

## 2024-05-02 ENCOUNTER — Encounter: Payer: Self-pay | Admitting: Pharmacy Technician

## 2024-05-02 ENCOUNTER — Other Ambulatory Visit (HOSPITAL_COMMUNITY): Payer: Self-pay

## 2024-05-02 NOTE — Telephone Encounter (Signed)
 error

## 2024-05-03 ENCOUNTER — Other Ambulatory Visit (HOSPITAL_COMMUNITY): Payer: Self-pay

## 2024-05-03 ENCOUNTER — Telehealth: Payer: Self-pay | Admitting: Pharmacy Technician

## 2024-05-03 NOTE — Telephone Encounter (Addendum)
 Pharmacy Patient Advocate Encounter   Received notification from [facility] KEY that prior authorization for FreeStyle Libre 3 Plus Sensor  is required/requested.   Insurance verification completed.   The patient is insured through [facility].   Per test claim: PA required and submitted KEY/EOC/Request #: [id number] due to    [employer]. Medication is covered under Part B, not Part D.**

## 2024-05-05 ENCOUNTER — Ambulatory Visit: Admitting: Nurse Practitioner

## 2024-05-05 ENCOUNTER — Encounter: Payer: Self-pay | Admitting: Nurse Practitioner

## 2024-05-05 VITALS — BP 132/88 | HR 63 | Temp 97.8°F | Ht 76.0 in | Wt 267.4 lb

## 2024-05-05 DIAGNOSIS — G4733 Obstructive sleep apnea (adult) (pediatric): Secondary | ICD-10-CM

## 2024-05-05 DIAGNOSIS — I10 Essential (primary) hypertension: Secondary | ICD-10-CM | POA: Diagnosis not present

## 2024-05-05 DIAGNOSIS — E7800 Pure hypercholesterolemia, unspecified: Secondary | ICD-10-CM | POA: Diagnosis not present

## 2024-05-05 DIAGNOSIS — Z789 Other specified health status: Secondary | ICD-10-CM | POA: Diagnosis not present

## 2024-05-05 DIAGNOSIS — Z7984 Long term (current) use of oral hypoglycemic drugs: Secondary | ICD-10-CM

## 2024-05-05 DIAGNOSIS — E1165 Type 2 diabetes mellitus with hyperglycemia: Secondary | ICD-10-CM

## 2024-05-05 LAB — COMPREHENSIVE METABOLIC PANEL WITH GFR
ALT: 51 U/L (ref 3–53)
AST: 30 U/L (ref 5–37)
Albumin: 4.5 g/dL (ref 3.5–5.2)
Alkaline Phosphatase: 34 U/L — ABNORMAL LOW (ref 39–117)
BUN: 14 mg/dL (ref 6–23)
CO2: 23 meq/L (ref 19–32)
Calcium: 9.6 mg/dL (ref 8.4–10.5)
Chloride: 106 meq/L (ref 96–112)
Creatinine, Ser: 0.92 mg/dL (ref 0.40–1.50)
GFR: 85.07 mL/min
Glucose, Bld: 111 mg/dL — ABNORMAL HIGH (ref 70–99)
Potassium: 4.2 meq/L (ref 3.5–5.1)
Sodium: 138 meq/L (ref 135–145)
Total Bilirubin: 0.6 mg/dL (ref 0.2–1.2)
Total Protein: 7.5 g/dL (ref 6.0–8.3)

## 2024-05-05 LAB — MICROALBUMIN / CREATININE URINE RATIO
Creatinine,U: 42.4 mg/dL
Microalb Creat Ratio: 276.8 mg/g — ABNORMAL HIGH (ref 0.0–30.0)
Microalb, Ur: 11.7 mg/dL — ABNORMAL HIGH (ref 0.7–1.9)

## 2024-05-05 LAB — LIPID PANEL
Cholesterol: 156 mg/dL (ref 28–200)
HDL: 34.7 mg/dL — ABNORMAL LOW
LDL Cholesterol: 65 mg/dL (ref 10–99)
NonHDL: 121.47
Total CHOL/HDL Ratio: 5
Triglycerides: 282 mg/dL — ABNORMAL HIGH (ref 10.0–149.0)
VLDL: 56.4 mg/dL — ABNORMAL HIGH (ref 0.0–40.0)

## 2024-05-05 MED ORDER — BLOOD GLUCOSE MONITORING SUPPL DEVI: 0 refills | Status: DC

## 2024-05-05 MED ORDER — TIRZEPATIDE 5 MG/0.5ML ~~LOC~~ SOAJ: 5.0000 mg | SUBCUTANEOUS | 2 refills | Status: DC

## 2024-05-05 MED ORDER — LANCETS MISC: 3 refills | Status: AC

## 2024-05-05 MED ORDER — BLOOD GLUCOSE TEST VI STRP: ORAL_STRIP | 3 refills | Status: AC

## 2024-05-05 MED ORDER — LANCET DEVICE MISC: 0 refills | Status: DC

## 2024-05-05 MED ORDER — BLOOD GLUCOSE MONITORING SUPPL DEVI: 0 refills | Status: AC

## 2024-05-05 MED ORDER — TIRZEPATIDE 2.5 MG/0.5ML ~~LOC~~ SOAJ: 2.5000 mg | SUBCUTANEOUS | 0 refills | Status: DC

## 2024-05-05 MED ORDER — LANCET DEVICE MISC: 0 refills | Status: AC

## 2024-05-05 MED ORDER — LANCETS MISC: 3 refills | Status: DC

## 2024-05-05 MED ORDER — REPATHA SURECLICK 140 MG/ML ~~LOC~~ SOAJ: 140.0000 mg | SUBCUTANEOUS | 5 refills | Status: AC

## 2024-05-05 MED ORDER — BLOOD GLUCOSE TEST VI STRP: ORAL_STRIP | 3 refills | Status: DC

## 2024-05-05 NOTE — Progress Notes (Signed)
 " Leron Glance, NP-C Phone: [phone]  Mitchell Case is a 69 y.o. male who presents today to establish care.   Discussed the use of AI scribe software for clinical note transcription with the patient, who gave verbal consent to proceed.  History of Present Illness   Mitchell Case is a 69 year old male with type 2 diabetes and hyperlipidemia who presents to establish care.  He has type 2 diabetes, with his most recent A1c recorded at 7.6% in January 2026. He is currently taking metformin  1000 mg twice daily, which he has gradually increased to this dose. He needs to be mindful of his proximity to a restroom due to gastrointestinal side effects. He previously used a Freestyle glucose monitor, but his insurance no longer covers it, making it unaffordable. He checks his blood sugar at home, typically in the morning, with recent readings around 150 mg/dL, though he has achieved lower readings of 127 mg/dL in the past. No excessive thirst, urination, or recent hypoglycemic episodes. He has tried Victoza  in the past, which was effective until insurance coverage ceased.  He has hyperlipidemia and is intolerant to statins, including rosuvastatin  and atorvastatin, as well as ezetimibe (Zetia), due to joint pain and inability to function. His LDL cholesterol was noted to be approximately 123 mg/dL. He has a family history of cardiac issues, including his father having stents and his mother undergoing mitral valve replacement and bypass surgeries.  He has hypertension, typically controlled with lisinopril  10 mg nightly, with home readings around 120/80 mmHg. However, a recent office reading was 159/103 mmHg, which he states is unusually high for him. No chest pain, shortness of breath, dizziness, swelling, headaches, or vision changes.  He has chronic back pain for which he sees an orthopedic specialist. He has had surgeries on both knees and experiences significant pain that has prevented him from exercising in  the past six months.  He has sleep apnea, using a CPAP machine nightly since 1991, which he finds effective in improving his sleep quality.  He has a history of kidney stones, having undergone three procedures to remove them, and currently takes Flomax  primarily for this condition.  He has macular degeneration and is legally blind, affecting his central vision but allowing peripheral vision.  He has allergies to statins, hydrochlorothiazide, penicillin, and sulfa drugs.  He has a history of weight fluctuations, having lost weight previously but regained it due to inactivity related to back pain. He acknowledges the need to lose weight but finds it challenging.  He has a family history of diabetes, with both parents and a grandmother having been on insulin  therapy.      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), bilateral 07/26/2020   Essential hypertension 07/17/2014   Iron deficiency anemia due to chronic blood loss 04/10/2014   Primary osteoarthritis of right knee 06/24/2020   Pure hypercholesterolemia 07/17/2014   OSA on CPAP 07/26/2020   Type 2 diabetes mellitus with hyperglycemia (HCC) 02/26/2016   Status post total left knee replacement 07/27/2020   Class 1 obesity due to excess calories with serious comorbidity and body mass index (BMI) of 33.0 to 33.9 in adult 05/22/2021   Elevated liver enzymes 10/25/2020   Fatty liver 10/25/2020   History of colonic polyps 10/25/2020   Statin intolerance 05/12/2024   Resolved Ambulatory Problems    Diagnosis Date Noted   Difficult intubation 11/11/2012   Kidney stone 07/26/2020   Lower GI  bleed 01/02/2014   Diverticulosis of colon 10/25/2020   Total knee replacement status 08/30/2021   Past Medical History:  Diagnosis Date   Arthritis    Diabetes mellitus without complication (HCC)    Family history of adverse reaction to anesthesia    GI bleed    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    History of kidney stones    HLD (hyperlipidemia)    Hypertension    Macular degeneration    Sleep apnea     Family History  Problem Relation Age of Onset   Diabetes Mother    Diabetes Father    Diabetes Brother     Social History   Socioeconomic History   Marital status: Married    Spouse name: Not on file   Number of children: Not on file   Years of education: Not on file   Highest education level: Some college, no degree  Occupational History   Not on file  Tobacco Use   Smoking status: Former    Types: Cigars   Smokeless tobacco: Never  Vaping Use   Vaping status: Never Used  Substance and Sexual Activity   Alcohol use: No   Drug use: No   Sexual activity: Not on file  Other Topics Concern   Not on file  Social History Narrative   Lives at home with wife Strong.   Social Drivers of Health   Tobacco Use: Medium Risk (05/05/2024)   Patient History    Smoking Tobacco Use: Former    Smokeless Tobacco Use: Never    Passive Exposure: Not on file  Financial Resource Strain: Low Risk (05/02/2024)   Overall Financial Resource Strain (CARDIA)    Difficulty of Paying Living Expenses: Not very hard  Food Insecurity: No Food Insecurity (05/02/2024)   Epic    Worried About Programme Researcher, Broadcasting/film/video in the Last Year: Never true    Ran Out of Food in the Last Year: Never true  Transportation Needs: No Transportation Needs (05/02/2024)   Epic    Lack of Transportation (Medical): No    Lack of Transportation (Non-Medical): No  Physical Activity: Sufficiently Active (05/02/2024)   Exercise Vital Sign    Days of Exercise per Week: 4 days    Minutes of Exercise per Session: 60 min  Stress: No Stress Concern Present (05/02/2024)   [employer] of Occupational Health - Occupational Stress Questionnaire    Feeling of Stress: Not at all  Social Connections: Unknown (05/02/2024)   Social Connection and Isolation Panel    Frequency of Communication with  Friends and Family: Twice a week    Frequency of Social Gatherings with Friends and Family: Not on file    Attends Religious Services: More than 4 times per year    Active Member of [employer] or Organizations: Yes    Attends Banker Meetings: Not on file    Marital Status: Married  Intimate Partner Violence: Not on file  Depression (PHQ2-9): Low Risk (05/05/2024)   Depression (PHQ2-9)    PHQ-2 Score: 0  Alcohol Screen: Not on file  Housing: Unknown (05/02/2024)   Epic    Unable to Pay for Housing in the Last Year: No    Number of Times Moved in the Last Year: Not on file    Homeless in the Last Year: No  Utilities: Not At Risk (02/16/2024)   Received from [facility]   Epic    In the past 12  months has the electric, gas, oil, or water company threatened to shut off services in your home?: No  Health Literacy: Not on file    ROS  General:  Negative for unexplained weight loss, fever Skin: Negative for new or changing mole, sore that won't heal HEENT: Negative for trouble hearing, trouble seeing, ringing in ears, mouth sores, hoarseness, change in voice, dysphagia. CV:  Negative for chest pain, dyspnea, edema, palpitations Resp: Negative for cough, dyspnea, hemoptysis GI: Negative for nausea, vomiting, diarrhea, constipation, abdominal pain, melena, hematochezia. GU: Negative for dysuria, incontinence, urinary hesitance, hematuria, vaginal or penile discharge, polyuria, sexual difficulty, lumps in testicle or breasts MSK: Negative for muscle cramps or aches, joint pain or swelling Neuro: Negative for headaches, weakness, numbness, dizziness, passing out/fainting Psych: Negative for depression, anxiety, memory problems  Objective  Physical Exam Vitals:   05/05/24 0959 05/05/24 1051  BP: (!) 159/103 132/88  Pulse: 63   Temp: 97.8 F (36.6 C)   SpO2: 96%     BP Readings from Last 3 Encounters:  05/05/24 132/88  04/19/24 122/80  06/02/22 138/84    Wt Readings from Last 3 Encounters:  05/05/24 267 lb 6.4 oz (121.3 kg)  04/19/24 268 lb (121.6 kg)  07/11/22 252 lb 3.2 oz (114.4 kg)    Physical Exam Constitutional:      General: He is not in acute distress.    Appearance: Normal appearance.  HENT:     Head: Normocephalic.  Cardiovascular:     Rate and Rhythm: Normal rate and regular rhythm.     Heart sounds: Normal heart sounds.  Pulmonary:     Effort: Pulmonary effort is normal.     Breath sounds: Normal breath sounds.  Skin:    General: Skin is warm and dry.  Neurological:     General: No focal deficit present.     Mental Status: He is alert.  Psychiatric:        Mood and Affect: Mood normal.        Behavior: Behavior normal.      Assessment/Plan:   Type 2 diabetes mellitus with hyperglycemia, without long-term current use of insulin  (HCC) Assessment & Plan: His A1c has increased to 7.6%. GLP-1 receptor agonists were discussed for better glycemic control and weight loss, with a preference for Mounjaro . Dietary modifications and exercise were emphasized. He will continue metformin  1000 mg twice daily. Insurance coverage for Mounjaro , Ozempic, Rybelsus, and Trulicity  will be explored. A new glucose meter and supplies were provided. Dietary modifications focusing on high protein and low carbohydrate intake were encouraged. A follow-up is scheduled in 6-8 weeks to assess diabetes management and medication efficacy.  Orders: -     Microalbumin / creatinine urine ratio -     Blood Glucose Monitoring Suppl; Use as directed to check blood sugar once daily. Dispense based on patient and insurance preference. (FOR ICD-10 E10.9, E11.9).  Dispense: 1 each; Refill: 0 -     Blood Glucose Test; Use as directed to check blood sugar once daily. Dispense based on patient and insurance preference. (FOR ICD-10 E10.9, E11.9).  Dispense: 100 strip; Refill: 3 -     Lancet Device; Use as directed to check blood sugar once daily. Dispense  based on patient and insurance preference. (FOR ICD-10 E10.9, E11.9).  Dispense: 1 each; Refill: 0 -     Lancets; Use as directed to check blood sugar once daily. Dispense based on patient and insurance preference.  (FOR ICD-10 E10.9, E11.9).  Dispense: 100  each; Refill: 3  Essential hypertension Assessment & Plan: His blood pressure was elevated at 159/103 mmHg initially, though typically well-controlled at home with lisinopril  10 mg daily. Significant improvement noted on recheck. Continue Lisinopril  10 mg daily and home monitoring. Check CMP.  Orders: -     Comprehensive metabolic panel with GFR  Pure hypercholesterolemia Assessment & Plan: He is intolerant to statins and ezetimibe due to joint pain and insomnia, with an elevated LDL cholesterol of 123 mg/dL, increasing cardiovascular risk. Repatha  (evolocumab ) was discussed as an alternative and initiated pending insurance approval. Check lipid panel today.   Orders: -     Comprehensive metabolic panel with GFR -     Lipid panel -     Repatha  SureClick; Inject 140 mg into the skin every 14 (fourteen) days.  Dispense: 2 mL; Refill: 5  OSA on CPAP Assessment & Plan: Well rested with CPAP use nightly. Continue.    Statin intolerance    Return in about 6 weeks (around 06/16/2024) for Follow up.   Leron Glance, NP-C [facility] "

## 2024-05-06 ENCOUNTER — Other Ambulatory Visit: Payer: Self-pay

## 2024-05-06 DIAGNOSIS — E119 Type 2 diabetes mellitus without complications: Secondary | ICD-10-CM

## 2024-05-06 MED ORDER — FREESTYLE LIBRE 3 PLUS SENSOR MISC: 1.0000 | 3 refills | Status: AC

## 2024-05-10 ENCOUNTER — Telehealth: Payer: Self-pay | Admitting: Pharmacist

## 2024-05-10 DIAGNOSIS — E1165 Type 2 diabetes mellitus with hyperglycemia: Secondary | ICD-10-CM

## 2024-05-10 MED ORDER — TRULICITY 3 MG/0.5ML ~~LOC~~ SOAJ: 3.0000 mg | SUBCUTANEOUS | 0 refills | Status: AC

## 2024-05-10 MED ORDER — TRULICITY 0.75 MG/0.5ML ~~LOC~~ SOAJ: 0.7500 mg | SUBCUTANEOUS | 0 refills | Status: AC

## 2024-05-10 MED ORDER — TRULICITY 1.5 MG/0.5ML ~~LOC~~ SOAJ: 1.5000 mg | SUBCUTANEOUS | 0 refills | Status: AC

## 2024-05-10 NOTE — Progress Notes (Signed)
 Brief Telephone Documentation Reason for Call: Medication Cost/Access (Repatha , GLP1)   Summary of Call: Called patient to discuss medication assistance eligibility. Left voicemail with direct callback number. Also sent MyChart message with more detail.   Current Payor: Doctor, General Practice Extra (PPO) Summary of Benefit:  [email or web address]  Rx Deductible: $500   Considerations Repatha  - screen for eligibility for [employer] HLD grant GLP1 - screen for Lilly Cares PAP. Depending on income, may qualify for free Trulicity  if brand medicaitons are unaffordable at the pharmacy   Manuelita FABIENE Kobs, PharmD, BCACP, CPP Clinical Pharmacist Practitioner [facility] Ph: [phone]

## 2024-05-10 NOTE — Progress Notes (Signed)
" ° °  05/10/2024 Name: Mitchell Case MRN: [id number] DOB: 06-30-1955  Subjective  Chief Complaint  Patient presents with   Medication Access   Care Team: Primary Care Provider: Gretel App, NP  Reason for visit: ?  WALDEMAR SIEGEL is a 69 y.o. male who presents today for a telephone visit with the pharmacist due to medication access concerns regarding their Repatha  and GLP1.   Medication Access: ?  Reports that all medications are not affordable (Repatha , Mounjaro ). All other medications (generics) have been $0 this year.   Prescription drug coverage: YES Payor: AETNA MEDICARE / Plan: AETNA MEDICARE HMO/PPO / Product Type: *No Product type* / .  Summary of Benefit: $500 deductible on brand meds Preferred brand med = 22% coinsurance   Current Patient Assistance: None  Patient lives in a household of 2 with an estimated combined annual income of ~200% FPL.  Medicare LIS Eligible: No  - monthly income limit exceeded  2025 Poverty Guidelines  Family Size  300%  500%   1  $46,950  $78,250  2  $63,450  $105,750  Programs Trulicity  (Lilly) MedicareD: HealthWell cholesterol grant   Assessment and Plan:   1. Medication Access Patient-reported income meets criteria for assistance for both Repatha  and Trulicity .   [employer]   Medication(s): All cholesterol medications (repatha ) Application Status:  Approved for initial enrollment    HealthWell ID: [id number] Fund: Hypercholesterolemia - Medicare Access Assistance Type: Co-pay Start Date: 04/10/2024 End Date: 04/09/2025               Rx Card: Card No.  [id number] RX BIN:  [id number] PCN:  [id number] Group:  [id number]  Called [facility] and provided grant information. Pharmacy confirms $0 copay.    Patient Assistance Program (PAP) Application Manufacturer: Secretary/administrator    (New enrollment) Medication(s): Trulicity  (not taking Mounjaro  due to cost)   Patient Portion of Application:  1/27: Completed with patient  via online enrollment tool. Submitted. Uploaded to Media Tab. [id number] Income Documentation: N/A - Electronic verification elected.  Provider Portion of Application:  05/10/24: Completed with provider consent via online re-enrollment portal. Prescription(s): Electronic Rx sent to [facility] ([facility]) - Pended to PCP for review/signature   Next Steps: Trulicity  Rx pended to PCP for review/signature.  Patient Advocate PAP Spreadsheet updated and Pool cc'd as FYI (application WAS submitted by clinic online)   Future Appointments  Date Time Provider Department Center  06/21/2024  8:20 AM Lester, Kacy, NP [facility] [address] Manuelita FABIENE Geronimo, PharmD, BCACP, CPP Clinical Pharmacist Practitioner [facility] Ph: [phone]  "

## 2024-05-11 ENCOUNTER — Ambulatory Visit: Payer: Self-pay | Admitting: Nurse Practitioner

## 2024-05-12 ENCOUNTER — Encounter: Payer: Self-pay | Admitting: Pharmacist

## 2024-05-12 ENCOUNTER — Telehealth: Payer: Self-pay | Admitting: Pharmacist

## 2024-05-12 ENCOUNTER — Encounter: Payer: Self-pay | Admitting: Nurse Practitioner

## 2024-05-12 DIAGNOSIS — Z789 Other specified health status: Secondary | ICD-10-CM | POA: Insufficient documentation

## 2024-05-12 NOTE — Assessment & Plan Note (Addendum)
 He is intolerant to statins and ezetimibe due to joint pain and insomnia, with an elevated LDL cholesterol of 123 mg/dL, increasing cardiovascular risk. Repatha  (evolocumab ) was discussed as an alternative and initiated pending insurance approval. Check lipid panel today.

## 2024-05-12 NOTE — Assessment & Plan Note (Signed)
 Well rested with CPAP use nightly. Continue.

## 2024-05-12 NOTE — Progress Notes (Signed)
 Patient Assistance Program (PAP) Application   Manufacturer: [employer] (AZ&Me)    (New enrollment) Medication(s): Farxiga 10 mg (albuminuria/DM)  Patient Portion of Application:  05/12/24: Filled out and uploaded to clinic eFax folder for patient signature in front office. Plans to sign 05/12/2024 Income Documentation: N/A - Electronic verification elected.  Provider Portion of Application:  01/29: Provider portion completed by PharmD and uploaded PCP eFax folder for signature. Clinical Pool/CMA notified.  Prescription(s): Included in PAP application. Farxiga 10 mg daily x90ds. 41yr refill.   Next Steps: [x]    Patient pages in front office eFax folder for signature on 1/30. Once signed, please fax to [facility] Med Advocate team (fax# below) [x]    Provider portion of application filled out and uploaded to [facility] eFax folder for review/signature []    CMA: Upon PCP signature, Application to be faxed to [facility] PAP team AND scanned to chart: [facility] PAP Team: CPhT Patient Advocate Team Fax: [phone] []    Med Advocate Team: Upon receipt via Med Advocate Onbase, please fax/submit to program.   Note routed to PCP Clinic Pool to ensure PCP signature is obtained and application is faxed. Patient Advocate PAP Spreadsheet updated and Pool cc'd as FYI (application not submitted by clinic, Advocate team to fax to company once received)  [facility] clinic team - Please document as Next Steps are completed in office*

## 2024-05-12 NOTE — Assessment & Plan Note (Signed)
 His A1c has increased to 7.6%. GLP-1 receptor agonists were discussed for better glycemic control and weight loss, with a preference for Mounjaro . Dietary modifications and exercise were emphasized. He will continue metformin  1000 mg twice daily. Insurance coverage for Mounjaro , Ozempic, Rybelsus, and Trulicity  will be explored. A new glucose meter and supplies were provided. Dietary modifications focusing on high protein and low carbohydrate intake were encouraged. A follow-up is scheduled in 6-8 weeks to assess diabetes management and medication efficacy.

## 2024-05-12 NOTE — Progress Notes (Signed)
 Brief Telephone Documentation Reason for Call: Patient left message regarding question for pharmacist  Summary of Call: Notes at recent PCP visit, Doreen start was discussed per elevated proteinuria.  He asks if this Rx can be sent to his regular pharmacy.   Discussed brand status (high copay. $181 for 30-day supply).  Also reviewed AZ&Me as an option to receive Farxiga free if income qualifies (based on prior discussion, feel she should qualify).   Will be in [employer].  Agreeable to stopping by front office to sign AZ&Me assistance form for Farxiga.  Will message PCP/CMA to see if Farxiga sample may be available to get started. If not, okay to start once PAP is approved.   Follow Up: Patient given direct line for further questions/concerns.  Manuelita FABIENE Kobs, PharmD, BCACP, CPP Clinical Pharmacist Practitioner [facility] at [facility] Ph: [phone]

## 2024-05-12 NOTE — Telephone Encounter (Signed)
 Called and informed pt that samples have been left upfront for his pick up.

## 2024-05-12 NOTE — Assessment & Plan Note (Addendum)
 His blood pressure was elevated at 159/103 mmHg initially, though typically well-controlled at home with lisinopril  10 mg daily. Significant improvement noted on recheck. Continue Lisinopril  10 mg daily and home monitoring. Check CMP.

## 2024-05-12 NOTE — Telephone Encounter (Signed)
 Medication Samples have been provided to the patient.  Drug name: Farxiga       Strength: 10 mg        Qty: 4 boxes (month supply)  LOT: [id number]  Exp.Date: 06-11-25  Dosing instructions: TAKE 1 TABLET DAILY  The patient has been instructed regarding the correct time, dose, and frequency of taking this medication, including desired effects and most common side effects.   Laurier Ellen 2:02 PM 05/12/2024

## 2024-05-12 NOTE — Progress Notes (Signed)
 Form placed in to be scanned into pts chart

## 2024-05-13 ENCOUNTER — Telehealth: Payer: Self-pay | Admitting: Nurse Practitioner

## 2024-05-13 NOTE — Telephone Encounter (Signed)
 Pt stopped in to pick up Farxiga  samples left up front and to sign AZ&ME papers, but forms were not found. Please reach out to pt and let front desk know when paperwork has been placed up front for signature

## 2024-05-18 ENCOUNTER — Other Ambulatory Visit: Payer: Self-pay | Admitting: Nurse Practitioner

## 2024-05-18 DIAGNOSIS — E1165 Type 2 diabetes mellitus with hyperglycemia: Secondary | ICD-10-CM

## 2024-05-18 MED ORDER — DAPAGLIFLOZIN PROPANEDIOL 10 MG PO TABS: 10.0000 mg | ORAL_TABLET | Freq: Every day | ORAL | 3 refills | Status: AC

## 2024-05-18 NOTE — Telephone Encounter (Signed)
 Pt filled out form for his patient assistance and placed in mail box up front.

## 2024-06-21 ENCOUNTER — Ambulatory Visit: Admitting: Nurse Practitioner

## 2024-07-20 ENCOUNTER — Ambulatory Visit: Admitting: "Endocrinology
